# Patient Record
Sex: Female | Born: 1955 | Race: White | Hispanic: No | Marital: Married | Smoking: Never smoker
Health system: Southern US, Community
[De-identification: ages and names within clinical notes are randomized; demographics above are authoritative.]

## PROBLEM LIST (undated history)

## (undated) DIAGNOSIS — T8859XA Other complications of anesthesia, initial encounter: Secondary | ICD-10-CM

## (undated) DIAGNOSIS — I2699 Other pulmonary embolism without acute cor pulmonale: Secondary | ICD-10-CM

## (undated) DIAGNOSIS — M1990 Unspecified osteoarthritis, unspecified site: Secondary | ICD-10-CM

## (undated) DIAGNOSIS — I10 Essential (primary) hypertension: Secondary | ICD-10-CM

## (undated) HISTORY — PX: APPENDECTOMY: SHX54

## (undated) HISTORY — PX: CERVICAL FUSION: SHX112

## (undated) HISTORY — PX: ABDOMINAL HYSTERECTOMY: SHX81

---

## 2004-09-06 ENCOUNTER — Ambulatory Visit (HOSPITAL_COMMUNITY): Admission: RE | Admit: 2004-09-06 | Discharge: 2004-09-06 | Payer: Self-pay | Admitting: Internal Medicine

## 2004-09-10 ENCOUNTER — Ambulatory Visit (HOSPITAL_COMMUNITY): Admission: RE | Admit: 2004-09-10 | Discharge: 2004-09-10 | Payer: Self-pay | Admitting: Internal Medicine

## 2006-01-17 ENCOUNTER — Ambulatory Visit (HOSPITAL_COMMUNITY): Admission: RE | Admit: 2006-01-17 | Discharge: 2006-01-17 | Payer: Self-pay | Admitting: Internal Medicine

## 2007-11-14 ENCOUNTER — Ambulatory Visit (HOSPITAL_COMMUNITY): Admission: RE | Admit: 2007-11-14 | Discharge: 2007-11-14 | Payer: Self-pay | Admitting: Internal Medicine

## 2007-11-28 ENCOUNTER — Ambulatory Visit (HOSPITAL_COMMUNITY): Admission: RE | Admit: 2007-11-28 | Discharge: 2007-11-28 | Payer: Self-pay | Admitting: Orthopaedic Surgery

## 2007-11-29 ENCOUNTER — Ambulatory Visit: Payer: Self-pay | Admitting: Oncology

## 2007-12-06 ENCOUNTER — Ambulatory Visit: Payer: Self-pay | Admitting: Oncology

## 2008-01-02 ENCOUNTER — Encounter (INDEPENDENT_AMBULATORY_CARE_PROVIDER_SITE_OTHER): Payer: Self-pay | Admitting: General Surgery

## 2008-01-02 ENCOUNTER — Inpatient Hospital Stay (HOSPITAL_COMMUNITY): Admission: EM | Admit: 2008-01-02 | Discharge: 2008-01-04 | Payer: Self-pay | Admitting: Emergency Medicine

## 2008-12-26 ENCOUNTER — Ambulatory Visit: Payer: Self-pay | Admitting: Oncology

## 2009-01-05 ENCOUNTER — Ambulatory Visit: Payer: Self-pay | Admitting: Oncology

## 2009-02-06 ENCOUNTER — Ambulatory Visit: Payer: Self-pay | Admitting: Oncology

## 2009-03-07 ENCOUNTER — Ambulatory Visit: Payer: Self-pay | Admitting: Oncology

## 2009-04-07 ENCOUNTER — Ambulatory Visit: Payer: Self-pay | Admitting: Oncology

## 2009-05-05 ENCOUNTER — Ambulatory Visit: Payer: Self-pay | Admitting: Oncology

## 2009-06-05 ENCOUNTER — Ambulatory Visit: Payer: Self-pay | Admitting: Oncology

## 2009-06-12 ENCOUNTER — Ambulatory Visit: Payer: Self-pay | Admitting: Oncology

## 2009-07-05 ENCOUNTER — Ambulatory Visit: Payer: Self-pay | Admitting: Oncology

## 2009-08-05 ENCOUNTER — Ambulatory Visit: Payer: Self-pay | Admitting: Oncology

## 2009-09-04 ENCOUNTER — Ambulatory Visit: Payer: Self-pay | Admitting: Oncology

## 2009-09-11 ENCOUNTER — Ambulatory Visit: Payer: Self-pay | Admitting: Oncology

## 2009-10-05 ENCOUNTER — Ambulatory Visit: Payer: Self-pay | Admitting: Oncology

## 2009-11-05 ENCOUNTER — Ambulatory Visit: Payer: Self-pay | Admitting: Oncology

## 2009-12-05 ENCOUNTER — Ambulatory Visit: Payer: Self-pay | Admitting: Oncology

## 2009-12-18 ENCOUNTER — Ambulatory Visit: Payer: Self-pay | Admitting: Oncology

## 2010-01-05 ENCOUNTER — Ambulatory Visit: Payer: Self-pay | Admitting: Oncology

## 2010-02-04 ENCOUNTER — Ambulatory Visit: Payer: Self-pay | Admitting: Oncology

## 2010-03-12 ENCOUNTER — Ambulatory Visit: Payer: Self-pay | Admitting: Oncology

## 2010-04-07 ENCOUNTER — Ambulatory Visit: Payer: Self-pay | Admitting: Oncology

## 2010-05-06 ENCOUNTER — Ambulatory Visit: Payer: Self-pay | Admitting: Oncology

## 2010-07-20 NOTE — Op Note (Signed)
[name], [name]            ACCOUNT NO.:  [id number]   MEDICAL RECORD NO.:  [id number]          PATIENT TYPE:  INP   LOCATION:  A306                          FACILITY:  [facility]   PHYSICIAN:  Barbaraann Barthel, M.D. DATE OF BIRTH:  13-Dec-1955   DATE OF PROCEDURE:  DATE OF DISCHARGE:                               OPERATIVE REPORT   SURGEON:  Barbaraann Barthel, MD   PREOPERATIVE DIAGNOSIS:  Acute appendicitis.   POSTOPERATIVE DIAGNOSIS:  Acute appendicitis.   PROCEDURE:  Open appendectomy.   WOUND CLASSIFICATION:  Infected.   SPECIMENS:  Appendix.   Note, this is a 55 year old white female who had 24-hour history of  right lower quadrant pain with anorexia and no vomiting.  It is  significant that this patient has been on immunosuppressive drugs for  her rheumatoid arthritis and has recently gotten off steroids for a  flareup of her rheumatoid arthritis.  CT scan showed fluid in the pelvis  and no obvious perforation, but an obviously acutely inflamed appendix.   GROSS OPERATIVE FINDINGS:  Acute suppurative nonperforated appendix that  appeared with significant fibrinous exudate.  Clinically, this appeared  to be accelerated inflammation from her steroid treatment.  As usually,  these findings were consistent with appendicitis of longer duration than  the 24 hour history she gave me.  There was no obvious perforation.  Terminal ileum appeared to be normal.  There was considerable fat  wrapping around the appendix, but no perforation.   TECHNIQUE:  The patient was placed in supine position after the adequate  administration of general anesthesia via endotracheal intubation.  Her  entire abdomen was prepped with Betadine solution and draped in the  usual manner.  A Foley catheter was aseptically inserted.  A transverse  incision was carried out in the right lower quadrant.  The skin and  subcutaneous tissue was divided.  The rectus muscle was retracted  medially and the  posterior sheath and peritoneum were grasped, and the  peritoneal cavity was then incised and opened.  We used a wound liner to  protect the wound from infection.  We then delivered the cecum and the  appendix into the wound where we clamped the mesoappendix with 2-0 silk  and smaller vessels with a Hemoclip device.  We then amputated the  appendix using the TA-30 stapler device.  I elected to over sew the  appendix with 3-0 GI silk and placed fat buttressing this over the  staple line.  We then changed gloves, irrigated the right lower  quadrant, and I elected to leave a Jackson-Pratt drain in the area where  she had most of her inflammation.  There  certainly exists the  possibility here for an abscess formation.  We irrigated the wound again  with sterile saline solution and then checked for hemostasis.  I closed  the peritoneum with a running 0 Vicryl suture and the fascia with  interrupted figure-of-eight Vicryl sutures and closed the skin with a  stapling device.  The drain was sutured in place with 3-0 nylon.  Sterile dressing was applied.  Prior to closure, all sponge, needle, and  instrument counts were found to be correct.  Estimated blood loss was  minimal.  The patient received 650 mL of crystalloids intraoperatively.  There  were no complications.  This patient will be admitted, and I will have  Dr. Ouida Sills take a look at her tomorrow because she acknowledges him as  her private physician and she has some issues with medications for her  arthritis.      Barbaraann Barthel, M.D.  Electronically Signed     WB/MEDQ  D:  01/02/2008  T:  01/03/2008  Job:  [id number]   cc:   Kingsley Callander. Ouida Sills, MD  Fax: [phone]

## 2010-07-20 NOTE — Discharge Summary (Signed)
[name], [name]            ACCOUNT NO.:  [id number]   MEDICAL RECORD NO.:  [id number]          PATIENT TYPE:  INP   LOCATION:  A306                          FACILITY:  [facility]   PHYSICIAN:  Barbaraann Barthel, M.D. DATE OF BIRTH:  1955-06-29   DATE OF ADMISSION:  01/02/2008  DATE OF DISCHARGE:  10/30/2009LH                               DISCHARGE SUMMARY   PROCEDURE:  Open appendectomy on January 02, 2008.   SECONDARY DIAGNOSES:  1. Acute appendicitis.  2. Rheumatoid arthritis.   Note, this is a 55 year old white female who was admitted to the  emergency room with approximately a 24-hour history of right lower  quadrant pain with anorexia and increasing discomfort.  She was worked  up and found on CT scan to have an appendicitis with no obvious  perforation.  She had minimal temperature elevation and a white count  with a slight left shift.  She had also been placed on steroids several  days and had just been off steroids several days prior to this for an  arthritic flare-up and she was taken to surgery after initiation of  hydration and antibiotic therapy.  Intraoperatively, she had an acute  suppurative nonperforated appendix.  The final pathology is pending at  the time of this dictation.  Intraoperatively, there were no other  findings.  However, it was pretty advanced appendicitis with fibrinous  exudate noted.  We did an appendectomy, irrigated the abdomen, and I  left a drain in place.  Postoperatively, the patient did very well.  She  remained afebrile.  Her white count diminished and she was passing gas  and tolerating full-liquid diet without any problems and her wound was  clean and she had no other symptoms of leg pain, shortness of breath,  any dysuria, or any other untoward postoperative findings.  She felt  quite good and we discharged her on the second postoperative day.  Perioperatively, she was kept on IV Cipro.  She will be discharged on  p.o. Cipro for an  additional 5 days.   LABORATORY DATA:  The patient's white count came down to 11.1 with an  H&H of 12.2 and 36.1 and the electrolytes within normal limits.  This  was noted on the first postoperative day.  CT scan as mentioned above  showed acute appendicitis with no obvious perforation.   HOSPITAL COURSE:  As mentioned above, completely uneventful.  The  patient did very well and was discharged on the second postoperative  day, at which time, her Jackson-Pratt drain was removed.   DISCHARGE INSTRUCTIONS:  She is discharged on Cipro 500 mg every 12  hours with the next dose to be given 11:00 p.m. on January 04, 2008.  She is to take Darvocet 1 tablet every 4 hours for pain, Colace 100 mg  daily or every other day as needed for her bowel.  She is told to take  no harsh cathartics like Ex-Lax.  She is told to hold aspirin products  and any arthritic meds, her methotrexate, etc., in the postoperative  period.   Wound care was explained to her.  She is  told to clean her wound with  alcohol 3 times a day.  She is discharged on a full and soft diet.  She  is told to do no heavy lifting.  She is permitted to go up and down the  stairs.  She is permitted  to shower, told to do no driving, no sexual activity in immediate  postoperative period.  We will follow her on Thursday, January 10, 2008,  at 2 o'clock p.m., and she is told to contact me or go to the emergency  room should there be any acute changes.      Barbaraann Barthel, M.D.  Electronically Signed     WB/MEDQ  D:  01/04/2008  T:  01/05/2008  Job:  [id number]   cc:   Kingsley Callander. Ouida Sills, MD  Fax: [phone]

## 2010-07-20 NOTE — Consult Note (Signed)
[name], DEARMAS NO.:  [id number]   MEDICAL RECORD NO.:  [id number]          PATIENT TYPE:  INP   LOCATION:  A306                          FACILITY:  [facility]   PHYSICIAN:  Barbaraann Barthel, M.D. DATE OF BIRTH:  [birth year]   DATE OF CONSULTATION:  01/02/2008  DATE OF DISCHARGE:                                 CONSULTATION   SURGEON:  Barbaraann Barthel, MD.   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   CHIEF COMPLAINT:  Right lower quadrant pain and anorexia for  approximately 24 hours duration.   HISTORY OF PRESENT MEDICAL ILLNESS:  The patient states that she was  feeling well until yesterday morning when she developed some right lower  quadrant pain.  This later increased so that when she did any coughing  or moving around too much, this affected her right lower quadrant and as  this did not resolve, the patient came to the emergency room where she  was worked up and a CT scan showed signs of acute appendicitis.  Surgery  was consulted and responded immediately.   PHYSICAL EXAMINATION:  GENERAL:  A pleasant 55 year old white female,  uncomfortable, but in no acute distress.  VITAL SIGNS:  She is 5 feet 4 inches, weighs 135 pounds, temperature is  97.8, her blood pressure is 125/77, the heart rate 88, and the  respirations are 18.  HEENT:  Head is normocephalic.  Eyes, extraocular movements are intact.  Pupils were round and reactive to light and accommodation.  There is no  conjunctive pallor or scleral injection.  Sclerae is a normal tincture.  Oral and nasal mucosa is moist.  NECK:  Supple and cylindrical.  There is no jugular vein distention,  thyromegaly, or tracheal deviation.  No bruits are auscultated.  CHEST:  Clear, both anterior and posterior auscultation.  There is a incision in  the area of her manubrium consistent with a previous history of  mediastinoscopy.  BREASTS:  The patient has  bilateral breast implants.  I do not palpate  any masses in the axilla or in the surrounding breast tissue.  LUNGS:  Clear.  HEART:  There is a regular rhythm.  ABDOMEN:  Bowel sounds are present.  The patient is exquisitely tender  in the right lower quadrant.  No femoral or inguinal hernias  appreciated.  RECTAL:  Guaiac-negative stool.  EXTREMITIES:  Grossly within normal limits.  The patient does have  multiple arthritic complaints in her ankles and her feet more than  elsewhere as well as her back and her shoulders and her right hip.  She  does not have any obvious ulnar deviation of her hands consistent with a  diagnosis of rheumatoid arthritis; however, she has borne that diagnosis  for the last 10 years or so where she has been treated recently with  methotrexate and other immunosuppressive drugs and she recently had a  flare up where she was treated with steroids a couple of weeks ago.  Sterroid treatment was completed 2 or 3 days ago.   REVIEW OF SYSTEMS:  OB/GYN:  She is a gravida 3, para 2, abortus 1,  cesarean 0 female with no family history of breast cancer and she had a  mammogram that was negative this year.  ENDOCRINE:  No history of  diabetes or thyroid disease.  MUSCULOSKELETAL:  History of rheumatoid  arthritis as mentioned.  CARDIORESPIRATORY:  Nonsmoker, social drinker.  She did have a history of fungal pneumonia that was treated in 2006.   OTHER SURGERIES:  Include a mediastinoscopy in 2006 and breast implants  in 1998.   LABORATORY DATA:  The patient has a white count of 15.4 with an H&H of  13.3 and 39.3 with 78% neutrophils.  Her electrolytes are grossly within  normal limits.  BUN is 7 and creatinine is 0.66.  CT scan showed changes  consistent with acute appendicitis.   MEDICATIONS:  See med list.  She is having her arthritic medicines  changed, but she was taking Remeron and she was on methotrexate.  She is  allergic to BIAXIN, TETRACYCLINE, and  CODEINE.   IMPRESSION:  acute appendicitis.   SECONDARY DIAGNOSIS:  Rheumatoid arthritis, on immunosuppressive drugs.   PLAN:  The patient will be covered with antibiotics.  Hydration has been  initiated.  I have discussed this surgery in detail with the patient and  her husband, they realized that the complications are bleeding and  infection and appendiceal leak.  She has been told that her incidence of  infection is a little greater due to the immunosuppressive drugs that  she has been on and an informed consent was obtained.      Barbaraann Barthel, M.D.  Electronically Signed     WB/MEDQ  D:  01/02/2008  T:  01/03/2008  Job:  [id number]   cc:   Kingsley Callander. Ouida Sills, MD  Fax: [phone]

## 2010-12-07 LAB — CBC
MCHC: 33.8
MCHC: 33.9
Platelets: 222
Platelets: 251
RBC: 3.69 — ABNORMAL LOW
RBC: 4.07
RDW: 13.7
RDW: 13.9

## 2010-12-07 LAB — DIFFERENTIAL
Eosinophils Absolute: 0
Eosinophils Absolute: 0
Eosinophils Relative: 0
Eosinophils Relative: 0
Lymphocytes Relative: 12
Lymphs Abs: 1.3
Lymphs Abs: 1.7
Monocytes Relative: 10
Neutro Abs: 12 — ABNORMAL HIGH

## 2010-12-07 LAB — COMPREHENSIVE METABOLIC PANEL
AST: 73 — ABNORMAL HIGH
Albumin: 3.7
Calcium: 9.3
Creatinine, Ser: 0.66
Sodium: 137
Total Bilirubin: 1.5 — ABNORMAL HIGH

## 2010-12-07 LAB — URINALYSIS, ROUTINE W REFLEX MICROSCOPIC
Glucose, UA: NEGATIVE
Protein, ur: NEGATIVE
Specific Gravity, Urine: <1.005 — ABNORMAL LOW
pH: 7.5

## 2010-12-07 LAB — BASIC METABOLIC PANEL
CO2: 26
Calcium: 8.6
Creatinine, Ser: 0.69
Glucose, Bld: 105 — ABNORMAL HIGH

## 2011-05-24 ENCOUNTER — Other Ambulatory Visit: Payer: Self-pay | Admitting: Oral Surgery

## 2011-05-24 DIAGNOSIS — M2669 Other specified disorders of temporomandibular joint: Secondary | ICD-10-CM

## 2011-05-30 ENCOUNTER — Other Ambulatory Visit (HOSPITAL_COMMUNITY): Payer: Self-pay | Admitting: Internal Medicine

## 2011-05-30 ENCOUNTER — Ambulatory Visit (HOSPITAL_COMMUNITY)
Admission: RE | Admit: 2011-05-30 | Discharge: 2011-05-30 | Disposition: A | Discharge status: Home / Self Care | Payer: BC Managed Care – PPO | Source: Ambulatory Visit | Attending: Internal Medicine | Admitting: Internal Medicine

## 2011-05-30 ENCOUNTER — Inpatient Hospital Stay: Admission: RE | Admit: 2011-05-30 | Payer: Self-pay | Source: Ambulatory Visit

## 2011-05-30 DIAGNOSIS — J3489 Other specified disorders of nose and nasal sinuses: Secondary | ICD-10-CM

## 2011-05-30 DIAGNOSIS — W19XXXA Unspecified fall, initial encounter: Secondary | ICD-10-CM

## 2012-03-19 ENCOUNTER — Emergency Department (HOSPITAL_COMMUNITY)
Admission: EM | Admit: 2012-03-19 | Discharge: 2012-03-19 | Disposition: A | Discharge status: Home / Self Care | Payer: BC Managed Care – PPO | Attending: Emergency Medicine | Admitting: Emergency Medicine

## 2012-03-19 ENCOUNTER — Encounter (HOSPITAL_COMMUNITY): Payer: Self-pay | Admitting: *Deleted

## 2012-03-19 ENCOUNTER — Emergency Department (HOSPITAL_COMMUNITY): Payer: BC Managed Care – PPO

## 2012-03-19 DIAGNOSIS — Y9389 Activity, other specified: Secondary | ICD-10-CM | POA: Insufficient documentation

## 2012-03-19 DIAGNOSIS — Z79899 Other long term (current) drug therapy: Secondary | ICD-10-CM | POA: Insufficient documentation

## 2012-03-19 DIAGNOSIS — Y92009 Unspecified place in unspecified non-institutional (private) residence as the place of occurrence of the external cause: Secondary | ICD-10-CM | POA: Insufficient documentation

## 2012-03-19 DIAGNOSIS — W010XXA Fall on same level from slipping, tripping and stumbling without subsequent striking against object, initial encounter: Secondary | ICD-10-CM | POA: Insufficient documentation

## 2012-03-19 DIAGNOSIS — Z8739 Personal history of other diseases of the musculoskeletal system and connective tissue: Secondary | ICD-10-CM | POA: Insufficient documentation

## 2012-03-19 DIAGNOSIS — S20219A Contusion of unspecified front wall of thorax, initial encounter: Secondary | ICD-10-CM | POA: Insufficient documentation

## 2012-03-19 HISTORY — DX: Unspecified osteoarthritis, unspecified site: M19.90

## 2012-03-19 LAB — URINALYSIS, DIPSTICK ONLY
Leukocytes, UA: NEGATIVE
Nitrite: NEGATIVE
Specific Gravity, Urine: 1.025 (ref 1.005–1.030)

## 2012-03-19 MED ORDER — ONDANSETRON 8 MG PO TBDP
8.0000 mg | ORAL_TABLET | Freq: Once | ORAL | Status: AC
  Administered 2012-03-19: 8 mg via ORAL
  Filled 2012-03-19: qty 1

## 2012-03-19 MED ORDER — ONDANSETRON HCL 8 MG PO TABS: 8.0000 mg | ORAL_TABLET | Freq: Three times a day (TID) | ORAL | Status: DC | PRN

## 2012-03-19 MED ORDER — OXYCODONE-ACETAMINOPHEN 5-325 MG PO TABS
1.0000 | ORAL_TABLET | Freq: Once | ORAL | Status: AC
  Administered 2012-03-19: 1 via ORAL
  Filled 2012-03-19: qty 1

## 2012-03-19 MED ORDER — OXYCODONE-ACETAMINOPHEN 5-325 MG PO TABS: 1.0000 | ORAL_TABLET | ORAL | Status: AC | PRN

## 2012-03-19 NOTE — ED Notes (Signed)
Pt states she tripped and fell and hit corner dresser when she fell. Bruising, abrasion and pain to right flank.

## 2012-03-21 NOTE — ED Provider Notes (Signed)
Medical screening examination/treatment/procedure(s) were performed by non-physician practitioner and as supervising physician I was immediately available for consultation/collaboration.   Uldine Fuster W Bryce Cheever, MD 03/21/12 2322 

## 2012-03-21 NOTE — ED Provider Notes (Signed)
History     CSN: [id number]  Arrival date & time 03/19/12  0909   First MD Initiated Contact with Patient 03/19/12 0913      Chief Complaint  Patient presents with  . Flank Pain  . Fall    (Consider location/radiation/quality/duration/timing/severity/associated sxs/prior treatment) HPI Comments: JENE HUQ is a 57 y.o. Female who presents with moderate right flank pain after tripping over her dog early this am, and landing with her right flank against her nightstand.  She reports immediate pain which has been persistent,  Worse with palpation and with movement, deep inspiration, slouching and lying on her right side.  She has taken a goody's powder with no relief of symptoms.  She denies shortness of breath , abdominal pain, nausea and has had no hematuria.     The history is provided by the patient.    Past Medical History  Diagnosis Date  . Arthritis     Past Surgical History  Procedure Date  . Appendectomy     No family history on file.  History  Substance Use Topics  . Smoking status: Never Smoker   . Smokeless tobacco: Not on file  . Alcohol Use: Yes     Comment: Occ    OB History    Grav Para Term Preterm Abortions TAB SAB Ect Mult Living                  Review of Systems  Constitutional: Negative for fever.  HENT: Negative for congestion, sore throat and neck pain.   Eyes: Negative.   Respiratory: Negative for cough, chest tightness, shortness of breath, wheezing and stridor.   Cardiovascular: Negative for chest pain.  Gastrointestinal: Negative for nausea and abdominal pain.  Genitourinary: Positive for flank pain. Negative for dysuria and hematuria.  Musculoskeletal: Negative for joint swelling and arthralgias.  Skin: Negative.  Negative for rash and wound.  Neurological: Negative for dizziness, weakness, light-headedness, numbness and headaches.  Hematological: Negative.   Psychiatric/Behavioral: Negative.     Allergies  Biaxin;  Codeine; and Tetracyclines & related  Home Medications   Current Outpatient Rx  Name  Route  Sig  Dispense  Refill  . ADALIMUMAB 40 MG/0.8ML [place] KIT   Subcutaneous   Inject 40 mg into the skin every 14 (fourteen) days.         Deeann Dowse BODY PAIN PO   Oral   Take 1 packet by mouth daily as needed. Hip pain related to RA         . FOLIC ACID 1 MG PO TABS   Oral   Take 1 mg by mouth daily.         Marland Kitchen LISINOPRIL 10 MG PO TABS   Oral   Take 10 mg by mouth daily.         Marland Kitchen ONDANSETRON HCL 8 MG PO TABS   Oral   Take 1 tablet (8 mg total) by mouth every 8 (eight) hours as needed for nausea.   12 tablet   0   . OXYCODONE-ACETAMINOPHEN 5-325 MG PO TABS   Oral   Take 1 tablet by mouth every 4 (four) hours as needed for pain.   30 tablet   0     BP 121/80  Pulse 93  Temp 98.4 F (36.9 C) (Oral)  Resp 16  Ht 5\' 4"  (1.626 m)  Wt 148 lb (67.132 kg)  BMI 25.40 kg/m2  SpO2 95%  Physical Exam  Nursing note and vitals  reviewed. Constitutional: She appears well-developed and well-nourished.  HENT:  Head: Normocephalic and atraumatic.  Eyes: Conjunctivae normal are normal.  Neck: Normal range of motion.  Cardiovascular: Normal rate, regular rhythm, normal heart sounds and intact distal pulses.   Pulmonary/Chest: Effort normal and breath sounds normal. She has no wheezes.   She exhibits tenderness.       Dark, linear ecchymosis right flank.  No crepitus,  No edema.  Poor effort with attempt at deep respiration due to increased pain.  Abdominal: Soft. Bowel sounds are normal. There is no tenderness.  Musculoskeletal: Normal range of motion.  Neurological: She is alert.  Skin: Skin is warm and dry.  Psychiatric: She has a normal mood and affect.    ED Course  Procedures (including critical care time)   Labs Reviewed  URINALYSIS, DIPSTICK ONLY  LAB REPORT - SCANNED   No results found.   1. Chest wall contusion       MDM  Patients labs and/or radiological  studies were reviewed during the medical decision making and disposition process. Pt prescribed oxycodone for pain,  Encouraged ice packs,  Starting heat tx in 2 days,  zofran also prescribed given pt reported nausea intolerance to narcotics. PRN f/u. Recheck by pcp if sx worsen, or return here.        Burgess Amor, [place] 03/21/12 2024

## 2014-10-21 ENCOUNTER — Other Ambulatory Visit (HOSPITAL_COMMUNITY): Payer: Self-pay | Admitting: Internal Medicine

## 2014-10-21 DIAGNOSIS — M5412 Radiculopathy, cervical region: Secondary | ICD-10-CM

## 2014-10-27 ENCOUNTER — Ambulatory Visit (HOSPITAL_COMMUNITY)
Admission: RE | Admit: 2014-10-27 | Discharge: 2014-10-27 | Disposition: A | Discharge status: Home / Self Care | Payer: BC Managed Care – PPO | Source: Ambulatory Visit | Attending: Internal Medicine | Admitting: Internal Medicine

## 2014-10-27 DIAGNOSIS — M25512 Pain in left shoulder: Secondary | ICD-10-CM | POA: Insufficient documentation

## 2014-10-27 DIAGNOSIS — M542 Cervicalgia: Secondary | ICD-10-CM | POA: Insufficient documentation

## 2014-10-27 DIAGNOSIS — M4722 Other spondylosis with radiculopathy, cervical region: Secondary | ICD-10-CM | POA: Diagnosis not present

## 2014-10-27 DIAGNOSIS — M5412 Radiculopathy, cervical region: Secondary | ICD-10-CM

## 2014-11-20 ENCOUNTER — Ambulatory Visit (HOSPITAL_COMMUNITY): Payer: BC Managed Care – PPO | Attending: Neurosurgery | Admitting: Physical Therapy

## 2014-11-20 DIAGNOSIS — M5412 Radiculopathy, cervical region: Secondary | ICD-10-CM | POA: Insufficient documentation

## 2014-11-20 DIAGNOSIS — G44221 Chronic tension-type headache, intractable: Secondary | ICD-10-CM | POA: Diagnosis present

## 2014-11-20 DIAGNOSIS — M6281 Muscle weakness (generalized): Secondary | ICD-10-CM | POA: Insufficient documentation

## 2014-11-20 DIAGNOSIS — M5382 Other specified dorsopathies, cervical region: Secondary | ICD-10-CM | POA: Diagnosis present

## 2014-11-20 DIAGNOSIS — R29898 Other symptoms and signs involving the musculoskeletal system: Secondary | ICD-10-CM

## 2014-11-20 NOTE — Therapy (Signed)
[place], [place], [address] Phone: [phone]   Fax:  [phone]  Physical Therapy Evaluation  Patient Details  Name: [name] MRN: [id number] Date of Birth: 1955-09-21 Referring Provider:  Blanche East, MD  Encounter Date: 11/20/2014      PT End of Session - 11/20/14 1619    Visit Number 1   Number of Visits 12   Date for PT Re-Evaluation 12/20/14   Authorization Type [employer]   Authorization - Visit Number 1   Authorization - Number of Visits 10   PT Start Time 3817   PT Stop Time 1610   PT Time Calculation (min) 55 min   Activity Tolerance Patient tolerated treatment well   Behavior During Therapy [facility] for tasks assessed/performed      Past Medical History  Diagnosis Date  . Arthritis     Past Surgical History  Procedure Laterality Date  . Appendectomy      There were no vitals filed for this visit.  Visit Diagnosis:  Cervical radicular pain  Chronic tension-type headache, intractable  Decreased grip strength of left hand  Neck muscle weakness      Subjective Assessment - 11/20/14 1510    Subjective [name] had a remote history of cervical fusion ,(Feb 2nd 2016).  She  did well following the surgery until the first of July.  Since then  she has been experiencing progressive pain that radiates into her LT arm to the elbow area as well as frequent headaches.   She has been referred to PT to reduce her sx of pain and improve her quality of life.     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 constant pain nothing changes the pain.    How long can you stand comfortably? constant pain nothing changes the pain   How long can you walk comfortably? constant pain nothing changes the pain   Currently in Pain? Yes  taking advil    Pain Score 6    Pain  Location Neck   Pain Orientation Left   Pain Descriptors / Indicators Aching;Burning   Pain Radiating Towards elbow    Pain Onset More than a month ago   Aggravating Factors  nothing   Pain Relieving Factors nothing             [facility] PT Assessment - 11/20/14 0001    Assessment   Medical Diagnosis Radicular Cervical pain    Onset Date/Surgical Date 04/08/14   Prior Therapy none for this diagnosis   Precautions   Precautions Cervical   Precaution Comments prominent impingement at C6-7 no activity that increases radiating sx    Required Braces or Orthoses --  none   Restrictions   Weight Bearing Restrictions No   Balance Screen   Has the patient fallen in the past 6 months No   Has the patient had a decrease in activity level because of a fear of falling?  Yes   Is the patient reluctant to leave their home because of a fear of falling?  No   Home Ecologist residence   Prior Function   Level of Independence Independent   Vocation Retired   Leisure sits with mother   Cognition   Overall Cognitive Status Within Functional Limits for tasks assessed   Observation/Other  Assessments   Focus on Therapeutic Outcomes (FOTO)  43   Posture/Postural Control   Posture/Postural Control Postural limitations   Postural Limitations Rounded Shoulders;Forward head   ROM / Strength   AROM / PROM / Strength AROM;Strength   AROM   AROM Assessment Site Shoulder;Cervical   Right/Left Shoulder --  [facility]   Cervical Extension 28 reps increase pain    Cervical - Right Side Bend 35 reps    Cervical - Left Side Bend 18 reps no change    Cervical - Right Rotation 50   Cervical - Left Rotation 48   Strength   Strength Assessment Site Shoulder;Hand;Cervical   Right/Left Shoulder Right;Left   Left Shoulder Flexion 4+/5   Left Shoulder ABduction 4/5   Left Shoulder Internal Rotation 4/5   Left Shoulder External Rotation 4-/5   Right/Left hand Right;Left   Right Hand  Grip (lbs) 58   Left Hand Grip (lbs) 34   Cervical Extension 3/5   Cervical - Right Side Bend 3+/5   Cervical - Left Side Bend 3/5                   [facility] Adult PT Treatment/Exercise - 11/20/14 0001    Exercises   Exercises Neck   Neck Exercises: Seated   Cervical Isometrics Extension;Right lateral flexion;Left lateral flexion;10 reps   Neck Retraction 10 reps   Lateral Flexion Right;Left;10 reps   Lateral Flexion Limitations cervical extension x 10    Other Seated Exercise scapular retraction x 10   Neck Exercises: Supine   Neck Retraction 10 reps   Neck Retraction Limitations scapular retraction x 10    Manual Therapy   Manual Therapy Manual Traction   Manual Traction gentle secondary to post fusion -pain decreased                 PT Education - 11/20/14 1615    Education provided Yes   Person(s) Educated Patient   Methods Explanation;Handout;Demonstration   Comprehension Verbalized understanding;Returned demonstration          PT Short Term Goals - 11/20/14 1629    PT SHORT TERM GOAL #1   Title I HEP   Time 3   Period Days   PT SHORT TERM GOAL #2   Title ROM to be wfl to allow safer driving   Time 2   Period Weeks   PT SHORT TERM GOAL #3   Title Pain level to decrease to a 3  for better life quality   Time 2   Period Weeks   PT SHORT TERM GOAL #4   Title Pt to only be having headaches twice a week    Time 2   Period Weeks   PT SHORT TERM GOAL #5   Title grip strength to increase 5# to allow pt to write for 15 minutes without fatigue   Time 2   Period Weeks           PT Long Term Goals - 11/20/14 1632    PT LONG TERM GOAL #1   Title I advance HEP   Time 4   Period Weeks   PT LONG TERM GOAL #2   Title Strength of cervical mm to increase one grade to allow pain level to decrease to 1/10 80% of the day    Time 4   Period Weeks   PT LONG TERM GOAL #3   Title Pt to be only having one headache a day    Time  4   Period Weeks    PT LONG TERM GOAL #4   Title Pt hand grip to increase 10#   Time 4   Period Weeks   PT LONG TERM GOAL #5   Title Pt to be able to verbalize the importance of posture in cervical care    Time 4   Period Weeks               Plan - 11/20/14 1621    Clinical Impression Statement [name] is a 59 yo female who had a multtiple level cervical fusion in February.  She did well until July when her pain returned.  She now is experience constant cervical pain with intermittent headaches.  She is currenly being referred to PT.  Examination demonstrates decreased cervical strengh, decreased hand grip, decreased shoulder strength, decreased cervical ROM, pain and mm spasm.  She will benefit from skilled PT to address these issues decrease her pain and improve her functional ability.    Pt will benefit from skilled therapeutic intervention in order to improve on the following deficits Decreased activity tolerance;Decreased range of motion;Decreased strength;Decreased scar mobility;Pain;Increased fascial restricitons   Rehab Potential Good   PT Frequency 3x / week   PT Duration 4 weeks   PT Treatment/Interventions ADLs/Self Care Home Management;Therapeutic activities;Therapeutic exercise;Patient/family education;Manual techniques   PT Next Visit Plan Pt to be given putty for had grip exercises, w-back, t-band exercises for posture ensuring cervical stability while completing, continue with manual including cervical traction.  Progress cervical and scapular stability exercises as tolerated.    PT Home Exercise Plan given    Consulted and Agree with Plan of Care Patient         Problem List There are no active problems to display for this patient.   Rayetta Humphrey, PT CLT [phone] 11/20/2014, 4:40 PM  [facility] [address] [place], [place], [address] Phone: [phone]   Fax:  [phone]

## 2014-11-20 NOTE — Patient Instructions (Signed)
Scapular Retraction (Standing)   With arms at sides, pinch shoulder blades together. Repeat __10__ times per set. Do _1___ sets per session. Do _3___ sessions per day.  http://orth.exer.us/944   Copyright  VHI. All rights reserved.  AROM: Neck Extension   Bend head backward. Hold __2__ seconds. Repeat __10__ times per set. Do ___1_ sets per session. Do _2___ sessions per day.  http://orth.exer.us/300   Copyright  VHI. All rights reserved.  AROM: Lateral Neck Flexion   Slowly tilt head toward one shoulder, then the other. Hold each position __3__ seconds. Repeat __10__ times per set. Do __1__ sets per session. Do _2___ sessions per day.  http://orth.exer.us/296   Copyright  VHI. All rights reserved.  Strengthening: Lateral Bend - Isometric (in Neutral)   Using light pressure from fingertips, press into right temple. Resist bending head sideways. Hold _3___ seconds. Repeat _10___ times per set. Do _1___ sets per session. Do __2__ sessions per day.  http://orth.exer.us/302   Copyright  VHI. All rights reserved.  Strengthening: Extension - Isometric (in Neutral)   Using light pressure from fingertips at back of head, resist bending head backward. Hold _3___ seconds. Repeat __10__ times per set. Do _1___ sets per session. Do ___2_ sessions per day.  http://orth.exer.us/308   Copyright  VHI. All rights reserved.

## 2014-11-24 ENCOUNTER — Ambulatory Visit (HOSPITAL_COMMUNITY): Payer: BC Managed Care – PPO | Admitting: Physical Therapy

## 2014-11-24 DIAGNOSIS — M5412 Radiculopathy, cervical region: Secondary | ICD-10-CM | POA: Diagnosis not present

## 2014-11-24 DIAGNOSIS — R29898 Other symptoms and signs involving the musculoskeletal system: Secondary | ICD-10-CM

## 2014-11-24 DIAGNOSIS — M5382 Other specified dorsopathies, cervical region: Secondary | ICD-10-CM

## 2014-11-24 DIAGNOSIS — G44221 Chronic tension-type headache, intractable: Secondary | ICD-10-CM

## 2014-11-24 NOTE — Therapy (Signed)
[place], [place], [address] Phone: [phone]   Fax:  [phone]  Physical Therapy Treatment  Patient Details  Name: [name] MRN: [id number] Date of Birth: [birth year] Referring Shaima Sardinas:  Asencion Noble, MD  Encounter Date: 11/24/2014      PT End of Session - 11/24/14 1709    Visit Number 2   Number of Visits 12   Date for PT Re-Evaluation 12/20/14   Authorization Type BCBS   Authorization - Visit Number 2   Authorization - Number of Visits 10   PT Start Time 7782   PT Stop Time 4235   PT Time Calculation (min) 43 min   Activity Tolerance Patient tolerated treatment well   Behavior During Therapy [facility] for tasks assessed/performed      Past Medical History  Diagnosis Date  . Arthritis     Past Surgical History  Procedure Laterality Date  . Appendectomy      There were no vitals filed for this visit.  Visit Diagnosis:  Cervical radicular pain  Chronic tension-type headache, intractable  Decreased grip strength of left hand  Neck muscle weakness      Subjective Assessment - 11/24/14 1604    Subjective Patient reports she is having a bit of rough day; she fell over the line of a gas pump trying to get gas in [place] the other day and her legs and L UE are sore and  are bothering her. She also reports that she had increased pain after last session.    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 Yes   Pain Score 6    Pain Location --  upper part of L shoulder and runs down to L elbow             [facility] PT Assessment - 11/24/14 0001    Observation/Other Assessments   Observations ROOS test negative                      [facility] Adult PT Treatment/Exercise - 11/24/14 0001    Neck Exercises: Seated   Neck Retraction 15 reps   Other Seated Exercise 3D  cervical and thoracic excursions 1x10; posterior shoulder rolls 1x20   Other Seated Exercise scapular retractions in W pose 1x15   Manual Therapy   Manual Therapy Soft tissue mobilization   Soft tissue mobilization focused on bilateral upper traps today    Manual Traction --   Neck Exercises: Stretches   Upper Trapezius Stretch 2 reps;30 seconds   Corner Stretch 2 reps;30 seconds                PT Education - 11/24/14 1708    Education provided Yes   Education Details reviewed intial eval and discussed HEP; discussed postural and musculoskeletal mechanics of posture and pathology regarding patient's diagnosis    Person(s) Educated Patient   Methods Explanation   Comprehension Verbalized understanding          PT Short Term Goals - 11/20/14 1629    PT SHORT TERM GOAL #1   Title I HEP   Time 3   Period Days   PT SHORT TERM GOAL #2   Title ROM to be wfl to allow safer driving   Time 2   Period Weeks   PT SHORT TERM GOAL #3  Title Pain level to decrease to a 3  for better life quality   Time 2   Period Weeks   PT SHORT TERM GOAL #4   Title Pt to only be having headaches twice a week    Time 2   Period Weeks   PT SHORT TERM GOAL #5   Title grip strength to increase 5# to allow pt to write for 15 minutes without fatigue   Time 2   Period Weeks           PT Long Term Goals - 11/20/14 1632    PT LONG TERM GOAL #1   Title I advance HEP   Time 4   Period Weeks   PT LONG TERM GOAL #2   Title Strength of cervical mm to increase one grade to allow pain level to decrease to 1/10 80% of the day    Time 4   Period Weeks   PT LONG TERM GOAL #3   Title Pt to be only having one headache a day    Time 4   Period Weeks   PT LONG TERM GOAL #4   Title Pt hand grip to increase 10#   Time 4   Period Weeks   PT LONG TERM GOAL #5   Title Pt to be able to verbalize the importance of posture in cervical care    Time 4   Period Weeks               Plan -  11/24/14 1710    Clinical Impression Statement Performed functional stretches and stability exercises today, as well as postural exercises and stretching. ROOS test negative except for patient's pre-existing symptoms today. Noted significant muscle knotting in bilateral upper traps and focused on this with manual today. Gave patient a tub of putty for L UE/hand strength and also reviewed/gave copy of initial eval.    Pt will benefit from skilled therapeutic intervention in order to improve on the following deficits Decreased activity tolerance;Decreased range of motion;Decreased strength;Decreased scar mobility;Pain;Increased fascial restricitons   Rehab Potential Good   PT Frequency 3x / week   PT Duration 4 weeks   PT Treatment/Interventions ADLs/Self Care Home Management;Therapeutic activities;Therapeutic exercise;Patient/family education;Manual techniques   PT Next Visit Plan w-back, t-band exercises for posture ensuring cervical stability while completing, continue with manual including cervical traction.  Progress cervical and scapular stability exercises as tolerated.    PT Home Exercise Plan given    Consulted and Agree with Plan of Care Patient        Problem List There are no active problems to display for this patient.   Deniece Ree PT, DPT [phone]  [facility] [address] [place], [place], [address] Phone: [phone]   Fax:  [phone]

## 2014-11-25 ENCOUNTER — Encounter (HOSPITAL_COMMUNITY): Payer: BC Managed Care – PPO

## 2014-11-27 ENCOUNTER — Ambulatory Visit (HOSPITAL_COMMUNITY): Payer: BC Managed Care – PPO | Admitting: Physical Therapy

## 2014-11-27 DIAGNOSIS — M5382 Other specified dorsopathies, cervical region: Secondary | ICD-10-CM

## 2014-11-27 DIAGNOSIS — M5412 Radiculopathy, cervical region: Secondary | ICD-10-CM

## 2014-11-27 DIAGNOSIS — R29898 Other symptoms and signs involving the musculoskeletal system: Secondary | ICD-10-CM

## 2014-11-27 DIAGNOSIS — G44221 Chronic tension-type headache, intractable: Secondary | ICD-10-CM

## 2014-11-27 NOTE — Therapy (Signed)
[facility] [address] [place], [place], [address] Phone: [phone]   Fax:  [phone]  Physical Therapy Treatment  Patient Details  Name: [name] MRN: [id number] Date of Birth: 26-Apr-1955 Referring Provider:  Asencion Noble, MD  Encounter Date: 11/27/2014      PT End of Session - 11/27/14 1208    Visit Number (p) 3   Number of Visits (p) 12   Date for PT Re-Evaluation (p) 12/20/14   Authorization Type (p) BCBS   Authorization - Visit Number (p) 3   Authorization - Number of Visits (p) 10   PT Start Time (p) 1015   PT Stop Time (p) 1100   PT Time Calculation (min) (p) 45 min   Activity Tolerance (p) Patient tolerated treatment well   Behavior During Therapy (p) WFL for tasks assessed/performed      Past Medical History  Diagnosis Date  . Arthritis     Past Surgical History  Procedure Laterality Date  . Appendectomy      There were no vitals filed for this visit.  Visit Diagnosis:  Cervical radicular pain  Chronic tension-type headache, intractable  Decreased grip strength of left hand  Neck muscle weakness      Subjective Assessment - 11/27/14 1015    Subjective Patient reports that she is feeling about the same, hasn't noticed any major changes yet but does realize that she has not been coming long at all.    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 Yes   Pain Score 5    Pain Location Other (Comment)  upper part of L shoulder and elbow    Pain Orientation Left                         [facility] Adult PT Treatment/Exercise - 11/27/14 0001    Neck Exercises: Standing   Wall Push Ups 10 reps   Other Standing Exercises Shoulder flexion and scaption 1x10 with 1#    Neck Exercises: Seated   Neck Retraction 15 reps   Other Seated Exercise 3D cervical and thoracic  excursions 1x15; posterior shoulder rolls 1x20   Other Seated Exercise scapular retractions in W pose 1x15   Manual Therapy   Manual Therapy Soft tissue mobilization   Soft tissue mobilization focused on bilateral upper traps today    Manual Traction gentle and done manually 5x15 seconds    Neck Exercises: Stretches   Upper Trapezius Stretch 3 reps;30 seconds   Corner Stretch 3 reps;30 seconds                PT Education - 11/27/14 1208    Education provided Yes   Education Details importance of good posture in reducing neck pain    Person(s) Educated Patient   Methods Explanation   Comprehension Verbalized understanding          PT Short Term Goals - 11/20/14 1629    PT SHORT TERM GOAL #1   Title I HEP   Time 3   Period Days   PT SHORT TERM GOAL #2   Title ROM to be wfl to allow safer driving   Time 2   Period Weeks   PT SHORT TERM GOAL #3   Title Pain level to decrease to a 3  for better life quality   Time  2   Period Weeks   PT SHORT TERM GOAL #4   Title Pt to only be having headaches twice a week    Time 2   Period Weeks   PT SHORT TERM GOAL #5   Title grip strength to increase 5# to allow pt to write for 15 minutes without fatigue   Time 2   Period Weeks           PT Long Term Goals - 11/20/14 1632    PT LONG TERM GOAL #1   Title I advance HEP   Time 4   Period Weeks   PT LONG TERM GOAL #2   Title Strength of cervical mm to increase one grade to allow pain level to decrease to 1/10 80% of the day    Time 4   Period Weeks   PT LONG TERM GOAL #3   Title Pt to be only having one headache a day    Time 4   Period Weeks   PT LONG TERM GOAL #4   Title Pt hand grip to increase 10#   Time 4   Period Weeks   PT LONG TERM GOAL #5   Title Pt to be able to verbalize the importance of posture in cervical care    Time 4   Period Weeks               Plan - 11/27/14 1259    Clinical Impression Statement Continued with functional stretches  and stabiliyt exercises today with good tolerance by patient. Did note some apparent scapular weakness and possible dyskinesis with flexion and abduction today. Also continued with gentle manual traction today.    Pt will benefit from skilled therapeutic intervention in order to improve on the following deficits Decreased activity tolerance;Decreased range of motion;Decreased strength;Decreased scar mobility;Pain;Increased fascial restricitons   Rehab Potential Good   PT Frequency 3x / week   PT Duration 4 weeks   PT Treatment/Interventions ADLs/Self Care Home Management;Therapeutic activities;Therapeutic exercise;Patient/family education;Manual techniques   PT Next Visit Plan w-back, t-band exercises for posture ensuring cervical stability while completing, continue with manual including cervical traction.  Progress cervical and scapular stability exercises as tolerated. Further assess possible scapular dyskineses.    PT Home Exercise Plan given    Consulted and Agree with Plan of Care Patient        Problem List There are no active problems to display for this patient.  Deniece Ree PT, DPT [phone]  [facility] [address] [place], [place], [address] Phone: [phone]   Fax:  [phone]

## 2014-12-02 ENCOUNTER — Ambulatory Visit (HOSPITAL_COMMUNITY): Payer: BC Managed Care – PPO | Admitting: Physical Therapy

## 2014-12-03 ENCOUNTER — Ambulatory Visit (HOSPITAL_COMMUNITY): Payer: BC Managed Care – PPO | Admitting: Physical Therapy

## 2014-12-03 DIAGNOSIS — G44221 Chronic tension-type headache, intractable: Secondary | ICD-10-CM

## 2014-12-03 DIAGNOSIS — M5412 Radiculopathy, cervical region: Secondary | ICD-10-CM | POA: Diagnosis not present

## 2014-12-03 DIAGNOSIS — R29898 Other symptoms and signs involving the musculoskeletal system: Secondary | ICD-10-CM

## 2014-12-03 DIAGNOSIS — M5382 Other specified dorsopathies, cervical region: Secondary | ICD-10-CM

## 2014-12-03 NOTE — Therapy (Signed)
[place], [place], [address] Phone: [phone]   Fax:  [phone]  Physical Therapy Treatment  Patient Details  Name: [name] MRN: [id number] Date of Birth: 1955/03/26 Referring Lexy Meininger:  Blanche East, MD  Encounter Date: 12/03/2014      PT End of Session - 12/03/14 1612    Visit Number 3   Number of Visits 12   Date for PT Re-Evaluation 12/20/14   Authorization Type BCBS   Authorization - Visit Number 3   Authorization - Number of Visits 10   PT Start Time 1522   PT Stop Time 1610   PT Time Calculation (min) 48 min      Past Medical History  Diagnosis Date  . Arthritis     Past Surgical History  Procedure Laterality Date  . Appendectomy      There were no vitals filed for this visit.  Visit Diagnosis:  Cervical radicular pain  Chronic tension-type headache, intractable  Decreased grip strength of left hand  Neck muscle weakness      Subjective Assessment - 12/03/14 1524    Subjective Pt states that her neck was feeling better but then she lifted something and it aggrevated her neck.    Currently in Pain? Yes   Pain Score 7    Pain Location Neck   Pain Orientation Left   Pain Type Chronic pain                         [facility] Adult PT Treatment/Exercise - 12/03/14 0001    Neck Exercises: Theraband   Scapula Retraction 10 reps;Red   Shoulder Extension 10 reps;Red   Rows 10 reps;Red   Neck Exercises: Standing   Other Standing Exercises Shoulder flexion and scaption 1x10 with 1#    Neck Exercises: Seated   Neck Retraction 15 reps   W Back Weights (lbs) 10x 2 #    Shoulder Shrugs 10 reps   Shoulder Shrugs Limitations up/back relax    Shoulder Flexion Both;10 reps;Weights   Shoulder Flexion Weights (lbs) 1   Shoulder ABduction Both;10 reps;Weights   Shoulder Abduction Weights (lbs) 1   Other Seated Exercise 3D cervical and thoracic excursions 1x15;  posterior shoulder rolls 1x20   Other Seated Exercise scapular retractions in W pose 1x15   Neck Exercises: Supine   Cervical Isometrics Extension;Right lateral flexion;Left lateral flexion;3 secs;10 reps   Manual Therapy   Manual Therapy Soft tissue mobilization;Manual Traction   Soft tissue mobilization focused on bilateral upper traps today    Manual Traction gentle and done manually 5x15 seconds    Neck Exercises: Stretches   Upper Trapezius Stretch 3 reps;30 seconds   Neck Stretch 3 reps;30 seconds                PT Education - 12/03/14 1611    Education provided Yes   Education Details t-band postural exercises    Person(s) Educated Patient   Methods Explanation;Demonstration;Handout   Comprehension Verbalized understanding;Returned demonstration          PT Short Term Goals - 11/20/14 1629    PT SHORT TERM GOAL #1   Title I HEP   Time 3   Period Days   PT SHORT TERM GOAL #2   Title ROM to be wfl to allow safer driving   Time 2   Period Weeks   PT SHORT TERM GOAL #3   Title Pain level  to decrease to a 3  for better life quality   Time 2   Period Weeks   PT SHORT TERM GOAL #4   Title Pt to only be having headaches twice a week    Time 2   Period Weeks   PT SHORT TERM GOAL #5   Title grip strength to increase 5# to allow pt to write for 15 minutes without fatigue   Time 2   Period Weeks           PT Long Term Goals - 11/20/14 1632    PT LONG TERM GOAL #1   Title I advance HEP   Time 4   Period Weeks   PT LONG TERM GOAL #2   Title Strength of cervical mm to increase one grade to allow pain level to decrease to 1/10 80% of the day    Time 4   Period Weeks   PT LONG TERM GOAL #3   Title Pt to be only having one headache a day    Time 4   Period Weeks   PT LONG TERM GOAL #4   Title Pt hand grip to increase 10#   Time 4   Period Weeks   PT LONG TERM GOAL #5   Title Pt to be able to verbalize the importance of posture in cervical care     Time 4   Period Weeks               Plan - 12/03/14 1612    Clinical Impression Statement Pt states she went to an orthopedic MD at [facility] who told her to continue physical therapy.  Pt states she was placed on new meds.  Pt had no mm spasms palpatable with manual although she did have some tightness in upper traps.  Ptwith improved discomfort with manual traction.    PT Next Visit Plan begin prone stability exercises including rows, shoulder extension,double arm raise and w-back         Problem List There are no active problems to display for this patient.  Rayetta Humphrey, PT CLT [phone]  12/03/2014, 4:17 PM  [facility] [address] [place], [place], [address] Phone: [phone]   Fax:  [phone]

## 2014-12-04 ENCOUNTER — Ambulatory Visit (HOSPITAL_COMMUNITY): Payer: BC Managed Care – PPO | Admitting: Physical Therapy

## 2014-12-04 DIAGNOSIS — G44221 Chronic tension-type headache, intractable: Secondary | ICD-10-CM

## 2014-12-04 DIAGNOSIS — M5412 Radiculopathy, cervical region: Secondary | ICD-10-CM | POA: Diagnosis not present

## 2014-12-04 DIAGNOSIS — R29898 Other symptoms and signs involving the musculoskeletal system: Secondary | ICD-10-CM

## 2014-12-04 DIAGNOSIS — M5382 Other specified dorsopathies, cervical region: Secondary | ICD-10-CM

## 2014-12-04 NOTE — Therapy (Signed)
[place], [place], [address] Phone: [phone]   Fax:  [phone]  Physical Therapy Treatment  Patient Details  Name: [name] MRN: [id number] Date of Birth: 16-Aug-1955 Referring Provider:  Blanche East, MD  Encounter Date: 12/04/2014      PT End of Session - 12/04/14 1050    Visit Number 4   Number of Visits 12   Date for PT Re-Evaluation 12/20/14   Authorization Type BCBS   Authorization - Visit Number 4   Authorization - Number of Visits 10   PT Start Time 1010   PT Stop Time 0947   PT Time Calculation (min) 43 min      Past Medical History  Diagnosis Date  . Arthritis     Past Surgical History  Procedure Laterality Date  . Appendectomy      There were no vitals filed for this visit.  Visit Diagnosis:  Cervical radicular pain  Chronic tension-type headache, intractable  Decreased grip strength of left hand  Neck muscle weakness      Subjective Assessment - 12/04/14 1004    Subjective Pt states that last night her pain was better but she woke up with increased pain    Currently in Pain? Yes   Pain Score 6    Pain Location Neck   Pain Orientation Left   Pain Descriptors / Indicators Aching   Pain Type Chronic pain   Pain Onset More than a month ago                         [facility] Adult PT Treatment/Exercise - 12/04/14 0001    Exercises   Exercises Neck   Neck Exercises: Machines for Strengthening   UBE (Upper Arm Bike) 4' baclward    Neck Exercises: Theraband   Scapula Retraction 10 reps;Green   Shoulder Extension 10 reps;Green   Rows 10 reps;Green   Neck Exercises: Standing   Wall Push Ups 10 reps   Other Standing Exercises Shoulder flexion,abduction  and scaption 2#x10 with 1#    Neck Exercises: Sidelying   Lateral Flexion Both;10 reps   Neck Exercises: Prone   Axial Exentsion 10 reps   Shoulder Extension 10 reps   Shoulder Extension Weights  (lbs) 2   Rows 10 reps   Rows Weights (lbs) 2   Manual Therapy   Manual Therapy Soft tissue mobilization;Manual Traction   Soft tissue mobilization focused on bilateral upper traps today    Manual Traction gentle and done manually 5x15 seconds                 PT Education - 12/03/14 1611    Education provided Yes   Education Details t-band postural exercises    Person(s) Educated Patient   Methods Explanation;Demonstration;Handout   Comprehension Verbalized understanding;Returned demonstration          PT Short Term Goals - 11/20/14 1629    PT SHORT TERM GOAL #1   Title I HEP   Time 3   Period Days   PT SHORT TERM GOAL #2   Title ROM to be wfl to allow safer driving   Time 2   Period Weeks   PT SHORT TERM GOAL #3   Title Pain level to decrease to a 3  for better life quality   Time 2   Period Weeks   PT SHORT TERM GOAL #4   Title Pt to only be  having headaches twice a week    Time 2   Period Weeks   PT SHORT TERM GOAL #5   Title grip strength to increase 5# to allow pt to write for 15 minutes without fatigue   Time 2   Period Weeks           PT Long Term Goals - 11/20/14 1632    PT LONG TERM GOAL #1   Title I advance HEP   Time 4   Period Weeks   PT LONG TERM GOAL #2   Title Strength of cervical mm to increase one grade to allow pain level to decrease to 1/10 80% of the day    Time 4   Period Weeks   PT LONG TERM GOAL #3   Title Pt to be only having one headache a day    Time 4   Period Weeks   PT LONG TERM GOAL #4   Title Pt hand grip to increase 10#   Time 4   Period Weeks   PT LONG TERM GOAL #5   Title Pt to be able to verbalize the importance of posture in cervical care    Time 4   Period Weeks               Plan - 12/04/14 1051    Clinical Impression Statement Pt feeling better overall.  Progressed pt to prone stabilization and sidelying strengthening exercises.  Continues to have tightness in upper trapezius mm but this is  decreasing    PT Next Visit Plan begin cervical extension with head over table; D/C t-band exercises to HEP as pt has good form          Problem List There are no active problems to display for this patient.  Rayetta Humphrey, PT CLT [phone] 12/04/2014, 10:54 AM  [facility] [place], [place], [address] Phone: [phone]   Fax:  [phone]

## 2014-12-08 ENCOUNTER — Ambulatory Visit (HOSPITAL_COMMUNITY): Payer: BC Managed Care – PPO | Attending: Neurosurgery | Admitting: Physical Therapy

## 2014-12-08 DIAGNOSIS — G44221 Chronic tension-type headache, intractable: Secondary | ICD-10-CM | POA: Insufficient documentation

## 2014-12-08 DIAGNOSIS — M6281 Muscle weakness (generalized): Secondary | ICD-10-CM | POA: Diagnosis present

## 2014-12-08 DIAGNOSIS — M5382 Other specified dorsopathies, cervical region: Secondary | ICD-10-CM | POA: Diagnosis present

## 2014-12-08 DIAGNOSIS — M5412 Radiculopathy, cervical region: Secondary | ICD-10-CM | POA: Diagnosis present

## 2014-12-08 DIAGNOSIS — R29898 Other symptoms and signs involving the musculoskeletal system: Secondary | ICD-10-CM

## 2014-12-08 NOTE — Therapy (Signed)
[place], [place], [address] Phone: [phone]   Fax:  [phone]  Physical Therapy Treatment  Patient Details  Name: [name] MRN: [id number] Date of Birth: 1955-11-01 Referring Provider:  Asencion Noble, MD  Encounter Date: 12/08/2014      PT End of Session - 12/08/14 1608    Visit Number 5   Number of Visits 12   Date for PT Re-Evaluation 12/20/14   Authorization Type BCBS   Authorization - Visit Number 5   Authorization - Number of Visits 10   PT Start Time 6659   PT Stop Time 1605   PT Time Calculation (min) 50 min   Activity Tolerance Patient tolerated treatment well      Past Medical History  Diagnosis Date  . Arthritis     Past Surgical History  Procedure Laterality Date  . Appendectomy      There were no vitals filed for this visit.  Visit Diagnosis:  Cervical radicular pain  Chronic tension-type headache, intractable  Decreased grip strength of left hand  Neck muscle weakness      Subjective Assessment - 12/08/14 1513    Subjective Pt states that she is hurting more under her arm today.    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 constant pain nothing changes the pain.    Currently in Pain? Yes   Pain Score 6    Pain Location Neck   Pain Orientation Left   Pain Descriptors / Indicators Aching   Pain Type Chronic pain                         [facility] Adult PT Treatment/Exercise - 12/08/14 1524    Exercises   Exercises Neck;Shoulder   Neck Exercises: Machines for Strengthening   UBE (Upper Arm Bike) 4' baclward    Neck Exercises: Theraband   Scapula Retraction --   Shoulder Extension --   Rows --   Neck Exercises: Standing   Wall Push Ups 10 reps   Thumb Tacks x10   Other Standing Exercises Shoulder  flexion,abduction  and scaption 2#x10 with 1#    Other Standing Exercises median N stretch 30" x 3   Neck Exercises: Seated   Other Seated Exercise 3D cervical and thoracic excursions 1x15; posterior shoulder rolls 1x20   Neck Exercises: Sidelying   Lateral Flexion Both;10 reps   Neck Exercises: Prone   Axial Exentsion 10 reps   W Back 10 reps   Shoulder Extension 10 reps   Shoulder Extension Weights (lbs) 2   Rows 10 reps   Rows Weights (lbs) 2   Upper Extremity Flexion with Stabilization Flexion;10 reps   Other Prone Exercise cervical extensin with head over table. x 10    Manual Therapy   Manual Therapy Soft tissue mobilization;Manual Traction   Soft tissue mobilization focused on bilateral upper traps today    Manual Traction gentle and done manually 5x15 seconds    Neck Exercises: Stretches   Other Neck Stretches radial N. stretch x 10                 PT Education - 12/08/14 1605    Education provided Yes   Education Details for median nerve stretch    Person(s) Educated Patient   Methods Explanation;Demonstration;Handout  Comprehension Verbalized understanding;Returned demonstration          PT Short Term Goals - 11/20/14 1629    PT SHORT TERM GOAL #1   Title I HEP   Time 3   Period Days   PT SHORT TERM GOAL #2   Title ROM to be wfl to allow safer driving   Time 2   Period Weeks   PT SHORT TERM GOAL #3   Title Pain level to decrease to a 3  for better life quality   Time 2   Period Weeks   PT SHORT TERM GOAL #4   Title Pt to only be having headaches twice a week    Time 2   Period Weeks   PT SHORT TERM GOAL #5   Title grip strength to increase 5# to allow pt to write for 15 minutes without fatigue   Time 2   Period Weeks           PT Long Term Goals - 11/20/14 1632    PT LONG TERM GOAL #1   Title I advance HEP   Time 4   Period Weeks   PT LONG TERM GOAL #2   Title Strength of cervical mm to increase one grade to allow pain level to  decrease to 1/10 80% of the day    Time 4   Period Weeks   PT LONG TERM GOAL #3   Title Pt to be only having one headache a day    Time 4   Period Weeks   PT LONG TERM GOAL #4   Title Pt hand grip to increase 10#   Time 4   Period Weeks   PT LONG TERM GOAL #5   Title Pt to be able to verbalize the importance of posture in cervical care    Time 4   Period Weeks               Plan - 12/08/14 1609    Clinical Impression Statement Pt frustrated with feeling better then regressing;  encouraged pt to continue to be aware of postrue and to complete scapular and cervical stability exercises.  Pt given median N. stretch.  Manual focused more on manual traction than soft tissue.  Will not complete mechanical due to fusion.    PT Next Visit Plan continue with stability exercises.         Problem List There are no active problems to display for this patient.  Rayetta Humphrey, PT CLT [phone] 12/08/2014, 4:12 PM  [facility] [address] [place], [place], [address] Phone: [phone]   Fax:  [phone]

## 2014-12-08 NOTE — Patient Instructions (Signed)
Upper Limb Neural Tension: Median II    Stand with left palm flat on wall, fingers up. Bend elbow, side-bend head away and Hold _30___ seconds. Straighten elbow and Hold _30___ seconds. Repeat 2____ times per set. Do 1____ sets per session. Do _2___ sessions per day.  http://orth.exer.us/404   Copyright  VHI. All rights reserved.

## 2014-12-10 ENCOUNTER — Ambulatory Visit (HOSPITAL_COMMUNITY): Payer: BC Managed Care – PPO | Admitting: Physical Therapy

## 2014-12-10 DIAGNOSIS — G44221 Chronic tension-type headache, intractable: Secondary | ICD-10-CM

## 2014-12-10 DIAGNOSIS — M5412 Radiculopathy, cervical region: Secondary | ICD-10-CM

## 2014-12-10 DIAGNOSIS — M5382 Other specified dorsopathies, cervical region: Secondary | ICD-10-CM

## 2014-12-10 NOTE — Therapy (Signed)
[facility] [place], [place], [address] Phone: [phone]   Fax:  [phone]  Physical Therapy Treatment  Patient Details  Name: [name] MRN: [id number] Date of Birth: [birth year] Referring Provider:  Asencion Noble, MD  Encounter Date: 12/10/2014      PT End of Session - 12/10/14 1610    Visit Number 6   Number of Visits 12   Date for PT Re-Evaluation 12/20/14   Authorization Type BCBS   Authorization - Visit Number 6   Authorization - Number of Visits 10   PT Start Time 8937   PT Stop Time 1603   PT Time Calculation (min) 48 min   Activity Tolerance Patient tolerated treatment well   Behavior During Therapy [facility] for tasks assessed/performed      Past Medical History  Diagnosis Date  . Arthritis     Past Surgical History  Procedure Laterality Date  . Appendectomy      There were no vitals filed for this visit.  Visit Diagnosis:  Cervical radicular pain  Chronic tension-type headache, intractable  Neck muscle weakness      Subjective Assessment - 12/10/14 1517    Subjective Pt reports that her pain was really bad yesterday, and she called her spine doctor about it. She reports that she is going to be getting cervical injections some time in the near future.    Currently in Pain? Yes   Pain Score 5    Pain Location Shoulder  shoulderblade and arm   Pain Orientation Left                [facility] Adult PT Treatment/Exercise - 12/10/14 0001    Neck Exercises: Machines for Strengthening   UBE (Upper Arm Bike) 4' baclward    Neck Exercises: Supine   Neck Retraction 10 reps;3 secs   Neck Exercises: Prone   W Back 10 reps   Shoulder Extension 10 reps   Shoulder Extension Weights (lbs) 2   Rows 10 reps   Rows Weights (lbs) 2   Other Prone Exercise prone T's with 2# x 10   Manual Therapy   Manual Therapy Soft tissue mobilization;Myofascial release   Soft tissue mobilization L upper trap,  mid trap, rhomboids, supraspinatus, infraspinatus in prone   Myofascial Release TrP release to L infraspinatus                PT Education - 12/10/14 1606    Education provided Yes   Education Details Educated on using tennis ball for infraspinatus trigger point release   Person(s) Educated Patient   Methods Explanation;Demonstration   Comprehension Verbalized understanding          PT Short Term Goals - 11/20/14 1629    PT SHORT TERM GOAL #1   Title I HEP   Time 3   Period Days   PT SHORT TERM GOAL #2   Title ROM to be wfl to allow safer driving   Time 2   Period Weeks   PT SHORT TERM GOAL #3   Title Pain level to decrease to a 3  for better life quality   Time 2   Period Weeks   PT SHORT TERM GOAL #4   Title Pt to only be having headaches twice a week    Time 2   Period Weeks   PT SHORT TERM GOAL #5   Title grip strength to increase 5# to allow pt to write for 15  minutes without fatigue   Time 2   Period Weeks           PT Long Term Goals - 11/20/14 1632    PT LONG TERM GOAL #1   Title I advance HEP   Time 4   Period Weeks   PT LONG TERM GOAL #2   Title Strength of cervical mm to increase one grade to allow pain level to decrease to 1/10 80% of the day    Time 4   Period Weeks   PT LONG TERM GOAL #3   Title Pt to be only having one headache a day    Time 4   Period Weeks   PT LONG TERM GOAL #4   Title Pt hand grip to increase 10#   Time 4   Period Weeks   PT LONG TERM GOAL #5   Title Pt to be able to verbalize the importance of posture in cervical care    Time 4   Period Weeks               Plan - 12/10/14 1612    Clinical Impression Statement Pt presented reporting pain in her scapular region and L shoulder. Manual therapy was completed to periscapular mm in prone, trigger point was present in L infraspinatus. Trigger point release was completed with reports of decreased pain following manual therapy. Postural strengthening was  continued today, along with supine chin tucks with focus on proper form. Pt reported decreased pain post treatment.    PT Next Visit Plan Continue with manual PRN, cervical stabilization and postural strengthening        Problem List There are no active problems to display for this patient.   Hilma Favors, PT, DPT [phone] 12/10/2014, 4:17 PM  [facility] [address] [place], [place], [address] Phone: [phone]   Fax:  [phone]

## 2014-12-12 ENCOUNTER — Ambulatory Visit (HOSPITAL_COMMUNITY): Payer: BC Managed Care – PPO | Admitting: Physical Therapy

## 2014-12-15 ENCOUNTER — Telehealth (HOSPITAL_COMMUNITY): Payer: Self-pay | Admitting: Physical Therapy

## 2014-12-15 ENCOUNTER — Ambulatory Visit (HOSPITAL_COMMUNITY): Payer: BC Managed Care – PPO | Admitting: Physical Therapy

## 2014-12-15 NOTE — Telephone Encounter (Signed)
Called RE missed appointment.   No answer.  [name], [place] CLT [phone]

## 2014-12-17 ENCOUNTER — Ambulatory Visit (HOSPITAL_COMMUNITY): Payer: BC Managed Care – PPO | Admitting: Physical Therapy

## 2014-12-17 DIAGNOSIS — M5382 Other specified dorsopathies, cervical region: Secondary | ICD-10-CM

## 2014-12-17 DIAGNOSIS — M5412 Radiculopathy, cervical region: Secondary | ICD-10-CM

## 2014-12-17 DIAGNOSIS — R29898 Other symptoms and signs involving the musculoskeletal system: Secondary | ICD-10-CM

## 2014-12-17 DIAGNOSIS — G44221 Chronic tension-type headache, intractable: Secondary | ICD-10-CM

## 2014-12-17 NOTE — Therapy (Signed)
[place], [place], [address] Phone: [phone]   Fax:  [phone]  Physical Therapy Treatment  Patient Details  Name: [name] MRN: [id number] Date of Birth: [birth year] Referring Provider:  Blanche East, MD  Encounter Date: 12/17/2014      PT End of Session - 12/17/14 1630    Visit Number 7   Number of Visits 12   Date for PT Re-Evaluation 12/20/14   Authorization Type BCBS   Authorization - Visit Number 7   Authorization - Number of Visits 10   PT Start Time 6195   PT Stop Time 1600   PT Time Calculation (min) 42 min   Activity Tolerance Patient tolerated treatment well   Behavior During Therapy [facility] for tasks assessed/performed      Past Medical History  Diagnosis Date  . Arthritis     Past Surgical History  Procedure Laterality Date  . Appendectomy      There were no vitals filed for this visit.  Visit Diagnosis:  Cervical radicular pain  Chronic tension-type headache, intractable  Neck muscle weakness  Decreased grip strength of left hand      Subjective Assessment - 12/17/14 1628    Subjective Pt states she returned to Dr Willey Blade on Monday and he put her on a steroid pack and hydrocodone.  States xrays show impingement at C5-6.  States she already feels better with pain at 3/10.   Currently in Pain? Yes   Pain Score 3    Pain Location Thoracic   Pain Orientation Left   Pain Descriptors / Indicators Aching                         [facility] Adult PT Treatment/Exercise - 12/17/14 1518    Neck Exercises: Machines for Strengthening   UBE (Upper Arm Bike) 4' backward    Neck Exercises: Theraband   Scapula Retraction 10 reps;Red   Shoulder Extension 10 reps;Red   Rows 10 reps;Red   Neck Exercises: Standing   Other Standing Exercises Shoulder flexion,abduction  and scaption 2#x10 with 1#    Neck Exercises: Prone   W Back 15 reps   Shoulder Extension 15 reps    Shoulder Extension Weights (lbs) 2   Rows 15 reps   Rows Weights (lbs) 2   Other Prone Exercise prone T's with 2# x 10   Manual Therapy   Manual Therapy Soft tissue mobilization;Myofascial release   Soft tissue mobilization Bil upper trap (Lt>Rt), mid trap, rhomboids, supraspinatus, infraspinatus in prone                  PT Short Term Goals - 11/20/14 1629    PT SHORT TERM GOAL #1   Title I HEP   Time 3   Period Days   PT SHORT TERM GOAL #2   Title ROM to be wfl to allow safer driving   Time 2   Period Weeks   PT SHORT TERM GOAL #3   Title Pain level to decrease to a 3  for better life quality   Time 2   Period Weeks   PT SHORT TERM GOAL #4   Title Pt to only be having headaches twice a week    Time 2   Period Weeks   PT SHORT TERM GOAL #5   Title grip strength to increase 5# to allow pt to write for 15 minutes without fatigue  Time 2   Period Weeks           PT Long Term Goals - 11/20/14 1632    PT LONG TERM GOAL #1   Title I advance HEP   Time 4   Period Weeks   PT LONG TERM GOAL #2   Title Strength of cervical mm to increase one grade to allow pain level to decrease to 1/10 80% of the day    Time 4   Period Weeks   PT LONG TERM GOAL #3   Title Pt to be only having one headache a day    Time 4   Period Weeks   PT LONG TERM GOAL #4   Title Pt hand grip to increase 10#   Time 4   Period Weeks   PT LONG TERM GOAL #5   Title Pt to be able to verbalize the importance of posture in cervical care    Time 4   Period Weeks               Plan - 12/17/14 1631    Clinical Impression Statement Pain reduced from last session due to new meds given by MD.  Continued with established therex without reports of pain.  Multimodal cues needed for form with therex, especially theraband exericises.  Manual completed in prone to Lt>Rt subscapular, rhomboids and paraspinal musculature.     PT Next Visit Plan Continue with manual PRN, cervical  stabilization and postural strengthening.        Problem List There are no active problems to display for this patient.   Teena Irani, PTA/CLT [phone] 12/17/2014, 4:34 PM  [facility] [address] [place], [place], [address] Phone: [phone]   Fax:  [phone]

## 2014-12-19 ENCOUNTER — Ambulatory Visit (HOSPITAL_COMMUNITY): Payer: BC Managed Care – PPO | Admitting: Physical Therapy

## 2014-12-22 ENCOUNTER — Ambulatory Visit (HOSPITAL_COMMUNITY): Payer: BC Managed Care – PPO | Admitting: Physical Therapy

## 2014-12-22 DIAGNOSIS — R29898 Other symptoms and signs involving the musculoskeletal system: Secondary | ICD-10-CM

## 2014-12-22 DIAGNOSIS — M5382 Other specified dorsopathies, cervical region: Secondary | ICD-10-CM

## 2014-12-22 DIAGNOSIS — G44221 Chronic tension-type headache, intractable: Secondary | ICD-10-CM

## 2014-12-22 DIAGNOSIS — M5412 Radiculopathy, cervical region: Secondary | ICD-10-CM | POA: Diagnosis not present

## 2014-12-22 NOTE — Therapy (Signed)
[place], [place], [address] Phone: [phone]   Fax:  [phone]  Physical Therapy Treatment  Patient Details  Name: [name] MRN: [id number] Date of Birth: [birth year] No Data Recorded  Encounter Date: 12/22/2014      PT End of Session - 12/22/14 1558    Visit Number 8   Number of Visits 12   Date for PT Re-Evaluation 12/20/14   Authorization - Visit Number 8   Authorization - Number of Visits 10   PT Start Time 1520   PT Stop Time 1600   PT Time Calculation (min) 40 min      Past Medical History  Diagnosis Date  . Arthritis     Past Surgical History  Procedure Laterality Date  . Appendectomy      There were no vitals filed for this visit.  Visit Diagnosis:  Cervical radicular pain  Chronic tension-type headache, intractable  Neck muscle weakness  Decreased grip strength of left hand      Subjective Assessment - 12/22/14 1527    Subjective Pt states she has been doing her HEP    Currently in Pain? Yes   Pain Score 3                          [facility] Adult PT Treatment/Exercise - 12/22/14 0001    Neck Exercises: Machines for Strengthening   UBE (Upper Arm Bike) 4' backward    Neck Exercises: Standing   Wall Push Ups 10 reps   Other Standing Exercises chest stretch/ median N stretch x 5 each.    Neck Exercises: Supine   Other Supine Exercise t-band decompression exercises;shoulder flexion; horizontal abduction; external rotation and sash with green t-band    Neck Exercises: Sidelying   Lateral Flexion Both;15 reps   Neck Exercises: Prone   Plank 5 x 15"    Other Prone Exercise cervical extension x 5    Manual Therapy   Manual Therapy Manual Traction   Soft tissue mobilization B upper trapezius    Manual Traction gentle                 PT Education - 12/22/14 1558    Education provided Yes   Education Details t-band supine  deconpression/postural exercises ; forarm planks.    Person(s) Educated Patient   Methods Explanation          PT Short Term Goals - 11/20/14 1629    PT SHORT TERM GOAL #1   Title I HEP   Time 3   Period Days   PT SHORT TERM GOAL #2   Title ROM to be wfl to allow safer driving   Time 2   Period Weeks   PT SHORT TERM GOAL #3   Title Pain level to decrease to a 3  for better life quality   Time 2   Period Weeks   PT SHORT TERM GOAL #4   Title Pt to only be having headaches twice a week    Time 2   Period Weeks   PT SHORT TERM GOAL #5   Title grip strength to increase 5# to allow pt to write for 15 minutes without fatigue   Time 2   Period Weeks           PT Long Term Goals - 11/20/14 1632    PT LONG TERM GOAL #1   Title I advance HEP  Time 4   Period Weeks   PT LONG TERM GOAL #2   Title Strength of cervical mm to increase one grade to allow pain level to decrease to 1/10 80% of the day    Time 4   Period Weeks   PT LONG TERM GOAL #3   Title Pt to be only having one headache a day    Time 4   Period Weeks   PT LONG TERM GOAL #4   Title Pt hand grip to increase 10#   Time 4   Period Weeks   PT LONG TERM GOAL #5   Title Pt to be able to verbalize the importance of posture in cervical care    Time 4   Period Weeks               Plan - 12/22/14 1559    Clinical Impression Statement added plank and supine decompression exercises with t-band to program with good form with verbal cuing.    PT Next Visit Plan progess to sitting t-band D1/D2        Problem List There are no active problems to display for this patient.   Rayetta Humphrey, PT CLT [phone] 12/22/2014, 4:07 PM  [facility] [address] [place], [place], [address] Phone: [phone]   Fax:  [phone]  Name: [name] MRN: [id number] Date of Birth: [birth year]

## 2014-12-24 ENCOUNTER — Ambulatory Visit (HOSPITAL_COMMUNITY): Payer: BC Managed Care – PPO | Admitting: Physical Therapy

## 2014-12-26 ENCOUNTER — Ambulatory Visit (HOSPITAL_COMMUNITY): Payer: BC Managed Care – PPO | Admitting: Physical Therapy

## 2014-12-26 DIAGNOSIS — G44221 Chronic tension-type headache, intractable: Secondary | ICD-10-CM

## 2014-12-26 DIAGNOSIS — M5382 Other specified dorsopathies, cervical region: Secondary | ICD-10-CM

## 2014-12-26 DIAGNOSIS — R29898 Other symptoms and signs involving the musculoskeletal system: Secondary | ICD-10-CM

## 2014-12-26 DIAGNOSIS — M5412 Radiculopathy, cervical region: Secondary | ICD-10-CM

## 2014-12-26 NOTE — Therapy (Addendum)
[facility] [address] [place], [place], [address] Phone: [phone]   Fax:  [phone]  Physical Therapy Treatment  Patient Details  Name: [name] MRN: [id number] Date of Birth: 1955/05/12 No Data Recorded  Encounter Date: 12/26/2014      PT End of Session - 12/26/14 1540    Visit Number 10   Number of Visits 10   Date for PT Re-Evaluation 12/20/14   Authorization - Visit Number 10   Authorization - Number of Visits 10   PT Start Time 1522   PT Stop Time 1602   PT Time Calculation (min) 40 min      Past Medical History  Diagnosis Date  . Arthritis     Past Surgical History  Procedure Laterality Date  . Appendectomy      There were no vitals filed for this visit.  Visit Diagnosis:  Cervical radicular pain  Chronic tension-type headache, intractable  Neck muscle weakness  Decreased grip strength of left hand      Subjective Assessment - 12/26/14 1535    Subjective Pt states she feels better.  Improving with the exercises   Currently in Pain? Yes   Pain Score 2             [facility] PT Assessment - 12/26/14 0001    Assessment   Medical Diagnosis Radicular Cervical pain    Onset Date/Surgical Date 04/08/14   Prior Therapy none for this diagnosis   Precautions   Precautions Cervical   Precaution Comments prominent impingement at C6-7 no activity that increases radiating sx    Required Braces or Orthoses --  none   Restrictions   Weight Bearing Restrictions No   Home Environment   Living Environment Private residence   Prior Function   Level of [place] Retired   Leisure sits with mother   Cognition   Overall Cognitive Status Within Functional Limits for tasks assessed   Observation/Other Assessments   Focus on Therapeutic Outcomes (FOTO)  72   Posture/Postural Control   Posture/Postural Control Postural limitations   Postural Limitations Rounded  Shoulders;Forward head   AROM   Cervical Extension 45  was 28   Cervical - Right Side Bend 38  was 35   Cervical - Left Side Bend 38  was 18   Cervical - Right Rotation 60  was 50    Cervical - Left Rotation 62  was 62   Strength   Left Shoulder Flexion 5/5  was 4+/5   Left Shoulder ABduction 5/5  was 4/5   Left Shoulder Internal Rotation 5/5  was 4/5   Left Shoulder External Rotation 5/5  was 4-/5   Right Hand Grip (lbs) 65  was 58   Left Hand Grip (lbs) 49  was 34   Cervical Extension 5/5  was 3/5   Cervical - Right Side Bend 4/5  was 3+/5   Cervical - Left Side Bend 4-/5  was 3/5                     [facility] Adult PT Treatment/Exercise - 12/26/14 0001    Exercises   Exercises Neck   Neck Exercises: Machines for Strengthening   UBE (Upper Arm Bike) 4' backward    Neck Exercises: Theraband   Other Theraband Exercises standing D1 B x 10    Neck Exercises: Standing   Wall Push Ups 5 reps   Other Standing Exercises chest  stretch/ median N stretch x 5 each.    Neck Exercises: Sidelying   Lateral Flexion Both;15 reps   Neck Exercises: Prone   W Back 15 reps   W Back Weights (lbs) 1   Shoulder Extension 15 reps   Shoulder Extension Weights (lbs) 2   Rows 15 reps   Rows Weights (lbs) 2   Upper Extremity Flexion with Stabilization Flexion   UE Flexion with Stabilization Limitations 1#   Plank 5 x 15"    Other Prone Exercise cervical extension x 5    Manual Therapy   Manual Therapy Manual Traction   Soft tissue mobilization B upper trapezius    Manual Traction gentle       Manual was completed separately from any other treatment this session.              PT Short Term Goals - 12/26/14 1542    PT SHORT TERM GOAL #1   Title I HEP   Time 3   Period --   PT SHORT TERM GOAL #2   Title ROM to be wfl to allow safer driving   Time 2   Period --   PT SHORT TERM GOAL #3   Title Pain level to decrease to a 3  for better life quality   Time 2    Period --   Status --   PT SHORT TERM GOAL #4   Title Pt to only be having headaches twice a week    Time 2   Period --   Status --   PT SHORT TERM GOAL #5   Title grip strength to increase 5# to allow pt to write for 15 minutes without fatigue   Baseline --   Time 2   Period Weeks   Status --           PT Long Term Goals - 12/26/14 1606    PT LONG TERM GOAL #1   Title I advance HEP   Time 4   Period Weeks   Status Achieved   PT LONG TERM GOAL #2   Title Strength of cervical mm to increase one grade to allow pain level to decrease to 1/10 80% of the day    Time 4   Period Weeks   Status On-going   PT LONG TERM GOAL #3   Title Pt to be only having one headache a day    Time 4   Period Weeks   Status Achieved   PT LONG TERM GOAL #4   Title Pt hand grip to increase 10#   Time 4   Period Weeks   Status Achieved   PT LONG TERM GOAL #5   Title Pt to be able to verbalize the importance of posture in cervical care    Time 4   Period Weeks   Status Achieved               Plan - 12/26/14 1617    Clinical Impression Statement Pt reassessed and has met all goals.  Reviewed exercises and form and urged pt to continue HEP    PT Next Visit Plan Discharge pt         Problem List There are no active problems to display for this patient.  Rayetta Humphrey, PT CLT [phone] 12/26/2014, 4:18 PM  [facility] [address] [place], [place], [address] Phone: [phone]   Fax:  [phone]  Name: [name] MRN: [id number]  Date of Birth: 1955/04/14  PHYSICAL THERAPY DISCHARGE SUMMARY  Visits from Start of Care: 10  Current functional level related to goals / functional outcomes: See above   Remaining deficits: Minimal pain   Education / Equipment: HEP  Plan: Patient agrees to discharge.  Patient goals were met. Patient is being discharged due to meeting the stated rehab goals.  ?????   Rayetta Humphrey, [place] CLT [phone]

## 2014-12-29 ENCOUNTER — Ambulatory Visit (HOSPITAL_COMMUNITY): Payer: BC Managed Care – PPO | Admitting: Physical Therapy

## 2014-12-31 ENCOUNTER — Encounter (HOSPITAL_COMMUNITY): Payer: BC Managed Care – PPO | Admitting: Physical Therapy

## 2015-01-02 ENCOUNTER — Encounter (HOSPITAL_COMMUNITY): Payer: BC Managed Care – PPO

## 2015-01-05 ENCOUNTER — Encounter (HOSPITAL_COMMUNITY): Payer: BC Managed Care – PPO | Admitting: Physical Therapy

## 2015-05-01 ENCOUNTER — Other Ambulatory Visit (HOSPITAL_COMMUNITY): Payer: Self-pay | Admitting: Surgical

## 2015-05-01 DIAGNOSIS — M4802 Spinal stenosis, cervical region: Secondary | ICD-10-CM

## 2015-05-04 ENCOUNTER — Ambulatory Visit (HOSPITAL_COMMUNITY): Payer: BC Managed Care – PPO

## 2015-05-19 ENCOUNTER — Ambulatory Visit (HOSPITAL_COMMUNITY): Payer: BC Managed Care – PPO

## 2016-01-11 ENCOUNTER — Encounter (HOSPITAL_COMMUNITY): Payer: Self-pay | Admitting: Physical Therapy

## 2016-01-11 ENCOUNTER — Ambulatory Visit (HOSPITAL_COMMUNITY): Payer: BC Managed Care – PPO | Attending: Rheumatology | Admitting: Physical Therapy

## 2016-01-11 DIAGNOSIS — M545 Low back pain: Secondary | ICD-10-CM | POA: Insufficient documentation

## 2016-01-11 DIAGNOSIS — R29898 Other symptoms and signs involving the musculoskeletal system: Secondary | ICD-10-CM | POA: Diagnosis present

## 2016-01-11 DIAGNOSIS — M25551 Pain in right hip: Secondary | ICD-10-CM | POA: Insufficient documentation

## 2016-01-11 NOTE — Therapy (Signed)
[facility] [address] [place], [place], [address] Phone: [phone]   Fax:  [phone]  Physical Therapy Evaluation  Patient Details  Name: [name] MRN: [id number] Date of Birth: 1955-03-14 Referring Provider: Denman George  Encounter Date: 01/11/2016      PT End of Session - 01/11/16 1555    Visit Number 1   Number of Visits 8   Date for PT Re-Evaluation 01/25/16   Authorization Type BC[employer]   Authorization Time Period 01/11/16 to 02/08/16   PT Start Time 0902   PT Stop Time 0950   PT Time Calculation (min) 48 min   Activity Tolerance Patient tolerated treatment well;No increased pain   Behavior During Therapy WFL for tasks assessed/performed      Past Medical History:  Diagnosis Date  . Arthritis     Past Surgical History:  Procedure Laterality Date  . APPENDECTOMY      There were no vitals filed for this visit.       Subjective Assessment - 01/11/16 0910    Subjective Pt reports LBP that began in mid summer. She saw Dr. Willey Blade who believed it was muscule related. She has been taking pain medication which helps, but she does not want to continue taking it for relief. She went to her rheumatologist who took Xrays and noted some arthritis in her low back. Some days are better than others, with pain about the same since its onset. She has been caring for her son's lab puppy lately which has been bothering her back.    Pertinent History HTN, RA, Cervical fusion, Rt hip pain with injections    Limitations Walking   How long can you sit comfortably? unlimited    How long can you stand comfortably? unsure exact time    How long can you walk comfortably? unsure    Diagnostic tests Xray: arthritis    Patient Stated Goals improve activity tolerance    Currently in Pain? Yes   Pain Score 5    Pain Location Back   Pain Orientation Lower;Right;Left   Pain Descriptors / Indicators Aching   Pain Type Chronic pain    Pain Radiating Towards Rt hip and anterior thigh    Pain Onset More than a month ago   Pain Frequency Intermittent   Aggravating Factors  standing/walking for long periods of time, bending side to side   Pain Relieving Factors sitting, laying down    Effect of Pain on Daily Activities discomfort during activity             [facility] PT Assessment - 01/11/16 0001      Assessment   Medical Diagnosis LBP with Rt side sciatica    Referring Provider Denman George   Onset Date/Surgical Date --  mid summer 2017   Next MD Visit May, earlier if need be   Prior Therapy none      Balance Screen   Has the patient fallen in the past 6 months Yes   How many times? 2  while trying to walk her son's puppy   Has the patient had a decrease in activity level because of a fear of falling?  No   Is the patient reluctant to leave their home because of a fear of falling?  No     Home Environment   Living Environment Private residence   Additional Comments 2-3 STE     Prior Function   Level of Independence Independent  Leisure caring for her son's 59 years old puppy      Cognition   Overall Cognitive Status Within Functional Limits for tasks assessed     Observation/Other Assessments   Focus on Therapeutic Outcomes (FOTO)  39% limitation     Sensation   Light Touch Appears Intact     Posture/Postural Control   Posture/Postural Control Postural limitations   Postural Limitations Rounded Shoulders;Forward head   Posture Comments Supine Rt ASIS inferior Lt; Standing/supine Lt lateral trunk shift      ROM / Strength   AROM / PROM / Strength AROM;Strength     AROM   AROM Assessment Site Lumbar   Lumbar Flexion Stretch pull, WNL   Lumbar Extension pain end range, WNL    Lumbar - Right Side Bend Knee jt line, Pain Rt side   Lumbar - Left Side Bend knee jt line, pain Rt side    Lumbar - Right Rotation pain Rt side   Lumbar - Left Rotation pain free     Strength   Strength Assessment  Site Hip;Knee;Ankle   Right/Left Hip Right;Left   Right Hip Flexion 4/5   Right Hip Extension 3+/5   Right Hip ABduction 4/5   Left Hip Flexion 4/5   Left Hip Extension 3+/5   Left Hip ABduction 4/5   Right/Left Knee Right;Left   Right Knee Flexion 5/5   Right Knee Extension 5/5   Left Knee Flexion 5/5   Left Knee Extension 5/5   Right/Left Ankle Right;Left   Right Ankle Dorsiflexion 5/5   Left Ankle Dorsiflexion 5/5     Palpation   Palpation comment non tender along lumbar paraspinals, tender along Rt iliopsoas, Rt PSIS      Special Tests    Special Tests Sacrolliac Tests;Leg LengthTest   Sacroiliac Tests  --  2 of 5 positive   Leg length test  Apparent     Apparent   Comments Rt>Lt                   [facility] Adult PT Treatment/Exercise - 01/11/16 0001      Manual Therapy   Manual Therapy Myofascial release;Muscle Energy Technique   Myofascial Release TrP release Rt iliacus/psoas   Muscle Energy Technique Lt posterior innominate rotation correction                 PT Education - 01/11/16 1554    Education provided Yes   Education Details eval findings/POC; deferred HEP to next session due to time constraints; implications for manual techniques   Person(s) Educated Patient   Methods Explanation;Demonstration   Comprehension Verbalized understanding;Returned demonstration          PT Short Term Goals - 01/11/16 1808      PT SHORT TERM GOAL #1   Title Pt will demo consistency and independence with HEP to improve strength and ROM.   Time 2   Period Weeks   Status New     PT SHORT TERM GOAL #2   Title Pt will report no greater than 4/10 max pain during daily activity to improve overall quality of life.    Time 3   Status New           PT Long Term Goals - 01/11/16 1810      PT LONG TERM GOAL #1   Title Pt will demo improved BLE strength to 5/5 MMT, to increase her safety with functional tasks.   Time 6   Period Weeks  Status New      PT LONG TERM GOAL #2   Title Pt will demo improved lumbar AROM with no more than 2/10 pain/stretch, to allow her to perform household chores such as sweeping and vacuuming.    Time 6   Period Weeks   Status New     PT LONG TERM GOAL #3   Title Pt will demo improved postural awareness evident by her ability to maintain upright posture without cues from therapist atleast 50% of the time throughout her session.    Time 6   Period Weeks   Status New     PT LONG TERM GOAL #4   Title Pt will demo improved muscle tenderness evident by her ability to perform active SLR x5 reps without repeoduction of Rt hip pain.   Time 6   Period Weeks   Status New               Plan - 01/11/16 1754    Clinical Impression Statement Pt is a 60yo F referred to OPPT concerning LBP with additional symptoms along her low back and Rt anterior thigh/groin area. She denies numbness and tingling as well as bowel/bladder dysfunction and she demonstrates pain with slight limitations in lumbar AROM as well as noted Lt lateral shift during standing and supine positions. Adam's forward bend test appeared to be negative at this time. I was unable to reproduce pain/symptoms with slump and passive straight leg raise testing and overall her LE flexibility is good. Pain was reproduced with palpation of her Rt iliacus/psoas and with active SLR and resisted deep hip flexion in supine. Manual MET was performed to address noted pelvic innominate, however pain was not alleviated until trigger point release was performed along her Rt iliacus. I reviewed findings with the pt and discussed POC with which she verbalized agreement. At this time, she would benefit from skilled PT to address her limitations in trunk stability, LE strength and lumbar ROM to improve her activity tolerance.    Rehab Potential Good   PT Frequency 2x / week   PT Duration 6 weeks   PT Treatment/Interventions ADLs/Self Care Home Management;Moist  Heat;Therapeutic exercise;Therapeutic activities;Functional mobility training;Neuromuscular re-education;Patient/family education;Manual techniques;Passive range of motion;Dry needling   PT Next Visit Plan lateral shift correction; trunk stabilization (ab set, bent knee raise, etc.)   PT Home Exercise Plan next visit   Recommended Other Services none   Consulted and Agree with Plan of Care Patient      Patient will benefit from skilled therapeutic intervention in order to improve the following deficits and impairments:  Decreased activity tolerance, Decreased strength, Impaired flexibility, Postural dysfunction, Pain, Improper body mechanics, Decreased range of motion, Decreased mobility, Increased muscle spasms  Visit Diagnosis: Low back pain, unspecified back pain laterality, unspecified chronicity, with sciatica presence unspecified  Pain in right hip  Other symptoms and signs involving the musculoskeletal system     Problem List There are no active problems to display for this patient.   6:16 PM,01/11/16 [place], DPT [facility] [address] [place], [place], [address] Phone: [phone]   Fax:  [phone]  Name: SHELLA LAHMAN MRN: [id number] Date of Birth: [birth year]

## 2016-01-14 ENCOUNTER — Ambulatory Visit (HOSPITAL_COMMUNITY): Payer: BC Managed Care – PPO | Admitting: Physical Therapy

## 2016-01-14 DIAGNOSIS — M545 Low back pain: Secondary | ICD-10-CM

## 2016-01-14 DIAGNOSIS — M25551 Pain in right hip: Secondary | ICD-10-CM

## 2016-01-14 DIAGNOSIS — R29898 Other symptoms and signs involving the musculoskeletal system: Secondary | ICD-10-CM

## 2016-01-14 NOTE — Therapy (Signed)
[name], [place], [address] Phone: [phone]   Fax:  [phone]  Physical Therapy Treatment  Patient Details  Name: [name] MRN: [id number] Date of Birth: 01/19/1956 Referring Provider: Denman George  Encounter Date: 01/14/2016      PT End of Session - 01/14/16 1548    Visit Number 2   Number of Visits 8   Date for PT Re-Evaluation 01/25/16   Authorization Type BCBS   Authorization Time Period 01/11/16 to 02/08/16   PT Start Time [id number]   PT Stop Time 1555   PT Time Calculation (min) 39 min   Activity Tolerance Patient tolerated treatment well;No increased pain   Behavior During Therapy WFL for tasks assessed/performed      Past Medical History:  Diagnosis Date  . Arthritis     Past Surgical History:  Procedure Laterality Date  . APPENDECTOMY      There were no vitals filed for this visit.      Subjective Assessment - 01/14/16 1520    Subjective Pt reports things are about the same since her initial evaluation. She has no other issues currently. She took an advil around lunch time.    Pertinent History HTN, RA, Cervical fusion, Rt hip pain with injections    Limitations Walking   How long can you sit comfortably? unlimited    How long can you stand comfortably? unsure exact time    How long can you walk comfortably? unsure    Diagnostic tests Xray: arthritis    Patient Stated Goals improve activity tolerance    Currently in Pain? No/denies   Pain Onset --            [facility] PT Assessment - 01/14/16 0001      Special Tests    Special Tests Lumbar   Lumbar Tests other     other   Findings Positive   Comments Adam's forward bend test standing and sitting                      [facility] Adult PT Treatment/Exercise - 01/14/16 0001      Posture/Postural Control   Posture Comments Rt pelvic crest higher than the other; Scoliosis      Exercises   Exercises Lumbar      Lumbar Exercises: Supine   Ab Set 15 reps;5 seconds   Heel Slides 10 reps   Heel Slides Limitations with ab set    Bent Knee Raise 15 reps   Bent Knee Raise Limitations with ab set    Bridge 10 reps   Bridge Limitations x2 sets    Other Supine Lumbar Exercises ab set with bent knee fallout x15 each side                PT Education - 01/14/16 1603    Education provided Yes   Education Details noted scoliosis curve likely contributing to lateral shift; HEP; importance of deep abdominal activation/endurance during daily activity for spine support.    Person(s) Educated Patient   Methods Explanation;Demonstration;Verbal cues;Handout   Comprehension Verbalized understanding;Returned demonstration          PT Short Term Goals - 01/11/16 1808      PT SHORT TERM GOAL #1   Title Pt will demo consistency and independence with HEP to improve strength and ROM.   Time 2   Period Weeks   Status New     PT SHORT  TERM GOAL #2   Title Pt will report no greater than 4/10 max pain during daily activity to improve overall quality of life.    Time 3   Status New           PT Long Term Goals - 01/11/16 1810      PT LONG TERM GOAL #1   Title Pt will demo improved BLE strength to 5/5 MMT, to increase her safety with functional tasks.   Time 6   Period Weeks   Status New     PT LONG TERM GOAL #2   Title Pt will demo improved lumbar AROM with no more than 2/10 pain/stretch, to allow her to perform household chores such as sweeping and vacuuming.    Time 6   Period Weeks   Status New     PT LONG TERM GOAL #3   Title Pt will demo improved postural awareness evident by her ability to maintain upright posture without cues from therapist atleast 50% of the time throughout her session.    Time 6   Period Weeks   Status New     PT LONG TERM GOAL #4   Title Pt will demo improved muscle tenderness evident by her ability to perform active SLR x5 reps without repeoduction of Rt  hip pain.   Time 6   Period Weeks   Status New               Plan - 01/14/16 1548    Clinical Impression Statement Today was [name]'s first treatment following her evaluation. Session focused on implementing HEP with deep abdominal activation with addition of LE movements as she was able to correctly perform. She initially required increased cues for proper technique with over activation of rectus abdominus, however this seemed to be much improved by the end of the session. Ended without report of pain. Will continue with current POC.   Rehab Potential Good   PT Frequency 2x / week   PT Duration 6 weeks   PT Treatment/Interventions ADLs/Self Care Home Management;Moist Heat;Therapeutic exercise;Therapeutic activities;Functional mobility training;Neuromuscular re-education;Patient/family education;Manual techniques;Passive range of motion;Dry needling   PT Next Visit Plan trunk stabilization (ab set, bent knee raise, etc.); postural correction exercises    PT Home Exercise Plan supine ab set hold 10x5 sec (2x); ab set with bent knee fall out x10 each (2x); ab set with bent knee raise x10 (2x)   Consulted and Agree with Plan of Care Patient      Patient will benefit from skilled therapeutic intervention in order to improve the following deficits and impairments:  Decreased activity tolerance, Decreased strength, Impaired flexibility, Postural dysfunction, Pain, Improper body mechanics, Decreased range of motion, Decreased mobility, Increased muscle spasms  Visit Diagnosis: Low back pain, unspecified back pain laterality, unspecified chronicity, with sciatica presence unspecified  Pain in right hip  Other symptoms and signs involving the musculoskeletal system     Problem List There are no active problems to display for this patient.  4:04 PM,01/14/16 [name] PT, DPT [facility] [address] [place], [place], [address] Phone: [phone]   Fax:  [phone]  Name: [name] MRN: [id number] Date of Birth: [birth year]

## 2016-01-18 ENCOUNTER — Ambulatory Visit (HOSPITAL_COMMUNITY): Payer: BC Managed Care – PPO | Admitting: Physical Therapy

## 2016-01-18 DIAGNOSIS — M545 Low back pain: Secondary | ICD-10-CM

## 2016-01-18 DIAGNOSIS — M25551 Pain in right hip: Secondary | ICD-10-CM

## 2016-01-18 DIAGNOSIS — R29898 Other symptoms and signs involving the musculoskeletal system: Secondary | ICD-10-CM

## 2016-01-18 NOTE — Therapy (Addendum)
[facility] [place], [place], [address] Phone: [phone]   Fax:  [phone]  Physical Therapy Treatment/Discharge  Patient Details  Name: [name] MRN: [id number] Date of Birth: 03-Jan-1956 Referring Provider: Denman George  Encounter Date: 01/18/2016      PT End of Session - 01/18/16 1046    Visit Number 3   Number of Visits 8   Date for PT Re-Evaluation 01/25/16   Authorization Type BCBS   Authorization Time Period 01/11/16 to 02/08/16   PT Start Time 1037   PT Stop Time 1115   PT Time Calculation (min) 38 min   Activity Tolerance Patient tolerated treatment well;No increased pain   Behavior During Therapy WFL for tasks assessed/performed      Past Medical History:  Diagnosis Date  . Arthritis     Past Surgical History:  Procedure Laterality Date  . APPENDECTOMY      There were no vitals filed for this visit.      Subjective Assessment - 01/18/16 1040    Subjective Pt reports that her Rt low back is bothering her today. She noticed once she got out of the car. Her hip has not been bothering her lately though.   Pertinent History HTN, RA, Cervical fusion, Rt hip pain with injections    Limitations Walking   How long can you sit comfortably? unlimited    How long can you stand comfortably? unsure exact time    How long can you walk comfortably? unsure    Diagnostic tests Xray: arthritis    Patient Stated Goals improve activity tolerance    Currently in Pain? Other (Comment)  Pain is nothing to worry about, just something she noticed                          [facility] Adult PT Treatment/Exercise - 01/18/16 0001      Exercises   Exercises Other Exercises   Other Exercises  seated trunk derotation with blue TB UE punches x15 each      Lumbar Exercises: Supine   Bent Knee Raise 10 reps   Bent Knee Raise Limitations contralat UE isometric hold x3 sec    Other Supine Lumbar  Exercises ab set with same side knee extension to flexion meeting UE at middle x15 each    Other Supine Lumbar Exercises Ab set with knee at 90 deg flexion, alt lowering 2x5 reps      Lumbar Exercises: Quadruped   Straight Leg Raise 10 reps;Other (comment)  x2 sets with Rt    Straight Leg Raises Limitations (+) trunk twist Rt kick > Lt kick                 PT Education - 01/18/16 1258    Education provided Yes   Education Details updated HEP; technique with therex    Person(s) Educated Patient   Methods Demonstration;Verbal cues;Other (comment);Explanation  emailed copy of HEP   Comprehension Verbalized understanding;Returned demonstration          PT Short Term Goals - 01/11/16 1808      PT SHORT TERM GOAL #1   Title Pt will demo consistency and independence with HEP to improve strength and ROM.   Time 2   Period Weeks   Status New     PT SHORT TERM GOAL #2   Title Pt will report no greater than 4/10 max pain during daily activity to  improve overall quality of life.    Time 3   Status New           PT Long Term Goals - 01/11/16 1810      PT LONG TERM GOAL #1   Title Pt will demo improved BLE strength to 5/5 MMT, to increase her safety with functional tasks.   Time 6   Period Weeks   Status New     PT LONG TERM GOAL #2   Title Pt will demo improved lumbar AROM with no more than 2/10 pain/stretch, to allow her to perform household chores such as sweeping and vacuuming.    Time 6   Period Weeks   Status New     PT LONG TERM GOAL #3   Title Pt will demo improved postural awareness evident by her ability to maintain upright posture without cues from therapist atleast 50% of the time throughout her session.    Time 6   Period Weeks   Status New     PT LONG TERM GOAL #4   Title Pt will demo improved muscle tenderness evident by her ability to perform active SLR x5 reps without repeoduction of Rt hip pain.   Time 6   Period Weeks   Status New                Plan - 01/18/16 1259    Clinical Impression Statement Pt continues to report improvements in pain. Also noting she is able to perform trunk stabilization and strengthening exercise without significant difficulty or reproduction of pain. Updated HEP to reflect these improvements and encouraged her to perform daily to ensure continued progression.   Rehab Potential Good   PT Frequency 2x / week   PT Duration 6 weeks   PT Treatment/Interventions ADLs/Self Care Home Management;Moist Heat;Therapeutic exercise;Therapeutic activities;Functional mobility training;Neuromuscular re-education;Patient/family education;Manual techniques;Passive range of motion;Dry needling   PT Next Visit Plan trunk stabilization (ab set, bent knee raise, etc.); postural correction exercises    PT Home Exercise Plan supine ab set bent knee at 90 deg with alt LE lowering, seated trunk derotation with blue TB, supine ab set with LE/UE flexion isometric.   Recommended Other Services none    Consulted and Agree with Plan of Care Patient      Patient will benefit from skilled therapeutic intervention in order to improve the following deficits and impairments:  Decreased activity tolerance, Decreased strength, Impaired flexibility, Postural dysfunction, Pain, Improper body mechanics, Decreased range of motion, Decreased mobility, Increased muscle spasms  Visit Diagnosis: Low back pain, unspecified back pain laterality, unspecified chronicity, with sciatica presence unspecified  Pain in right hip  Other symptoms and signs involving the musculoskeletal system     Problem List There are no active problems to display for this patient.  1:03 PM,01/18/16 [name] PT, DPT [facility] [place] [address] [place], [place], [address] Phone: [phone]   Fax:  [phone]  Name: [name] MRN:  [id number] Date of Birth: 12-02-1955  PHYSICAL THERAPY DISCHARGE SUMMARY  Visits from Start of Care: 3  Current functional level related to goals / functional outcomes: See above for pt's most recent functional level.    Remaining deficits: See above for pt's most recently stated functional deficits.   Education / Equipment: HEP provided at initial evaluation and updated on her most recent visit. Plan: Patient agrees to discharge.  Patient goals were not met. Patient is  being discharged due to not returning since the last visit.  ?????    4:13 PM,03/17/16 [name] PT, [facility] [phone]

## 2016-01-21 ENCOUNTER — Encounter (HOSPITAL_COMMUNITY): Payer: BC Managed Care – PPO | Admitting: Physical Therapy

## 2016-01-26 ENCOUNTER — Ambulatory Visit (HOSPITAL_COMMUNITY): Payer: BC Managed Care – PPO | Admitting: Physical Therapy

## 2016-01-26 ENCOUNTER — Telehealth (HOSPITAL_COMMUNITY): Payer: Self-pay | Admitting: Physical Therapy

## 2016-01-26 NOTE — Telephone Encounter (Signed)
No Show #1. Attempted to call pt about missed appointment this afternoon. Pt no answer and mailbox was full. Unable to remind pt of next appointment.   4:41 PM,01/26/16 [name] PT, [facility] [phone]

## 2016-02-02 ENCOUNTER — Ambulatory Visit (HOSPITAL_COMMUNITY): Payer: BC Managed Care – PPO | Admitting: Physical Therapy

## 2019-09-04 ENCOUNTER — Encounter (INDEPENDENT_AMBULATORY_CARE_PROVIDER_SITE_OTHER): Payer: Self-pay | Admitting: *Deleted

## 2019-09-30 ENCOUNTER — Other Ambulatory Visit (INDEPENDENT_AMBULATORY_CARE_PROVIDER_SITE_OTHER): Payer: Self-pay | Admitting: *Deleted

## 2019-09-30 ENCOUNTER — Telehealth (INDEPENDENT_AMBULATORY_CARE_PROVIDER_SITE_OTHER): Payer: Self-pay | Admitting: *Deleted

## 2019-09-30 ENCOUNTER — Encounter (INDEPENDENT_AMBULATORY_CARE_PROVIDER_SITE_OTHER): Payer: Self-pay | Admitting: *Deleted

## 2019-09-30 MED ORDER — SUTAB 1479-225-188 MG PO TABS: 1.0000 | ORAL_TABLET | Freq: Once | ORAL | 0 refills | Status: AC

## 2019-09-30 NOTE — Telephone Encounter (Signed)
Patient needs [employer] (copay card) ° °

## 2019-09-30 NOTE — Telephone Encounter (Signed)
Referring MD/PCP: fagan   Procedure: tcs w mac room 1  Reason/Indication:  screening  Has patient had this procedure before?  no  If so, when, by whom and where?    Is there a family history of colon cancer?  no  Who?  What age when diagnosed?    Is patient diabetic?   no      Does patient have prosthetic heart valve or mechanical valve?  no  Do you have a pacemaker/defibrillator?  no  Has patient ever had endocarditis/atrial fibrillation? no  Does patient use oxygen? no  Has patient had joint replacement within last 12 months?  no  Is patient constipated or do they take laxatives? no  Does patient have a history of alcohol/drug use?  no  Is patient on blood thinner such as Coumadin, Plavix and/or Aspirin? no  Medications: lisinopril 10 mg daily, humira 40 mg one pen every other week  Allergies: biaxin, tetracycline  Medication Adjustment per Dr Rehman/Dr Jenetta Downer   Procedure date & time: 10/30/19

## 2019-09-30 NOTE — Telephone Encounter (Signed)
Ok to schedule in room 1.  Thanks,  Harvel Quale, MD Gastroenterology and Hepatology [facility]

## 2019-10-02 ENCOUNTER — Other Ambulatory Visit (INDEPENDENT_AMBULATORY_CARE_PROVIDER_SITE_OTHER): Payer: Self-pay | Admitting: *Deleted

## 2019-10-02 DIAGNOSIS — Z1211 Encounter for screening for malignant neoplasm of colon: Secondary | ICD-10-CM

## 2019-10-16 ENCOUNTER — Encounter (HOSPITAL_COMMUNITY): Admission: RE | Admit: 2019-10-16 | Payer: BC Managed Care – PPO | Source: Ambulatory Visit

## 2019-10-28 ENCOUNTER — Other Ambulatory Visit (HOSPITAL_COMMUNITY): Payer: BC Managed Care – PPO

## 2019-11-14 ENCOUNTER — Encounter (HOSPITAL_COMMUNITY)
Admission: RE | Admit: 2019-11-14 | Discharge: 2019-11-14 | Disposition: A | Discharge status: Home / Self Care | Payer: BC Managed Care – PPO | Source: Ambulatory Visit | Attending: Gastroenterology | Admitting: Gastroenterology

## 2019-11-14 ENCOUNTER — Other Ambulatory Visit: Payer: Self-pay

## 2019-11-25 ENCOUNTER — Other Ambulatory Visit (HOSPITAL_COMMUNITY)
Admission: RE | Admit: 2019-11-25 | Discharge: 2019-11-25 | Disposition: A | Discharge status: Home / Self Care | Payer: BC Managed Care – PPO | Source: Ambulatory Visit | Attending: Gastroenterology | Admitting: Gastroenterology

## 2019-11-27 ENCOUNTER — Encounter (HOSPITAL_COMMUNITY): Admission: RE | Payer: Self-pay | Source: Home / Self Care

## 2019-11-27 ENCOUNTER — Ambulatory Visit (HOSPITAL_COMMUNITY)
Admission: RE | Admit: 2019-11-27 | Payer: BC Managed Care – PPO | Source: Home / Self Care | Admitting: Gastroenterology

## 2019-11-27 SURGERY — COLONOSCOPY WITH PROPOFOL
Anesthesia: Monitor Anesthesia Care

## 2020-03-11 ENCOUNTER — Encounter (HOSPITAL_COMMUNITY): Payer: Self-pay

## 2020-03-11 ENCOUNTER — Other Ambulatory Visit: Payer: Self-pay

## 2020-03-11 ENCOUNTER — Ambulatory Visit (HOSPITAL_COMMUNITY): Payer: BC Managed Care – PPO | Attending: Rheumatology

## 2020-03-11 DIAGNOSIS — M545 Low back pain, unspecified: Secondary | ICD-10-CM | POA: Diagnosis present

## 2020-03-11 DIAGNOSIS — G8929 Other chronic pain: Secondary | ICD-10-CM | POA: Insufficient documentation

## 2020-03-11 DIAGNOSIS — R262 Difficulty in walking, not elsewhere classified: Secondary | ICD-10-CM | POA: Diagnosis present

## 2020-03-11 NOTE — Therapy (Signed)
[place], [place], [address] Phone: [phone]   Fax:  [phone]  Physical Therapy Evaluation  Patient Details  Name: [name] MRN: [id number] Date of Birth: Apr 21, 1955 Referring Provider (PT): Dr. Sherrine Maples Remer Macho   Encounter Date: 03/11/2020   PT End of Session - 03/11/20 1128    Visit Number 1    Number of Visits 12    Date for PT Re-Evaluation 04/22/20    Authorization Type BCBS COMM PPO    Progress Note Due on Visit 10    PT Start Time 1119    PT Stop Time 1205    PT Time Calculation (min) 46 min    Activity Tolerance Patient tolerated treatment well           Past Medical History:  Diagnosis Date  . Arthritis     Past Surgical History:  Procedure Laterality Date  . APPENDECTOMY      There were no vitals filed for this visit.    Subjective Assessment - 03/11/20 1123    Subjective Patient reports progressive LBP with right side pain accompanying it.  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 Reports sitting/laying down somewhat relieves her pain. Reports feeling a "catch" in her back with position changes.    How long can you stand comfortably? 15-20 min    Currently in Pain? Yes    Pain Score 5     Pain Location Back    Pain Orientation Right    Pain Descriptors / Indicators Aching    Pain Type Chronic pain    Pain Radiating Towards Right low back/buttock    Pain Onset More than a month ago    Pain Frequency Intermittent    Aggravating Factors  carrying grandson, position changes              [facility] PT Assessment - 03/11/20 0001      Assessment   Medical Diagnosis Chronic right-side LBP without Sciatica    Referring Provider (PT) Dr. Sherrine Maples [facility]      Balance Screen   Has the patient fallen in the past 6 months No      [place]  residence    Living Arrangements Spouse/significant other    Available Help at Discharge Family    Type of [place]      Prior Function   Level of 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    Leisure walking      Observation/Other Assessments   Observations left lateral sidebend deviation in standing   poor lifting mechanics demonstrated   Focus on Therapeutic Outcomes (FOTO)  47% function      Sensation   Light Touch Appears Intact      Posture/Postural Control   Posture/Postural Control Postural limitations    Postural Limitations Weight shift left      ROM / Strength   AROM / PROM / Strength AROM;Strength      AROM   AROM Assessment Site Lumbar    Lumbar Flexion WNL   left, lateral shift deviation   Lumbar Extension 25% limited    Lumbar - Right Side Bend WNL    Lumbar - Left Side Bend painful right side      Strength  Overall Strength Within functional limits for tasks performed   gross 4/5 BLE   Strength Assessment Site Lumbar    Lumbar Flexion 3-/5   by functional assessment requiring extensive UE use to assume supine to sit     Flexibility   Soft Tissue Assessment /Muscle Length yes    Hamstrings WNL    Quadriceps WNL    Quadratus Lumborum Right side tenderness and hip hike      Palpation   Palpation comment Right QL TTP and notable right hip hike in standing      Special Tests    Special Tests Lumbar    Lumbar Tests FABER test;Prone Knee Bend Test;Straight Leg Raise      FABER test   findings Negative    Side Right   Lt     Prone Knee Bend Test   Findings Negative    Side Right   LT     Straight Leg Raise   Findings Negative    Side  Right   LT     Ambulation/Gait   Ambulation/Gait Yes    Ambulation/Gait Assistance 7: Independent    Ambulation Distance (Feet) 225 Feet    Assistive device None    Gait Pattern Trendelenburg   right  retro-Trendelenberg   Ambulation Surface Level    Gait velocity decreased    Gait Comments                      Objective measurements completed on examination: See above findings.               PT Education - 03/11/20 1209    Education Details Patient education in task modification and body mechanics awareness with emphasis on static standing activities with placing one foot elevated. Postural education in sitting with lumbar support on couch and car.  Training/education in body mechanics for lifting her infant grandson. Education in exam findings and lumbar anatomy    Person(s) Educated Patient    Methods Explanation;Demonstration    Comprehension Verbalized understanding;Need further instruction            PT Short Term Goals - 03/11/20 1227      PT SHORT TERM GOAL #1   Title Pt will demo consistency and independence with HEP to improve strength and ROM.    Time 3    Period Weeks    Status New    Target Date 04/01/20      PT SHORT TERM GOAL #2   Title Pt will report no greater than 3/10 max pain during daily activity to improve overall quality of life.    Baseline 5/10 with movement    Time 3    Period Weeks    Status New    Target Date 04/01/20      PT SHORT TERM GOAL #3   Title Patient will demo proper squat-lift technique 5/5 observations to improve body mechanics and reduce spinal pain    Baseline poor body mechanics and awareness with functional lifts    Time 3    Period Weeks    Status New    Target Date 04/01/20             PT Long Term Goals - 03/11/20 1229      PT LONG TERM GOAL #1   Title Patient will report at least 75% improvement in symptoms for improved quality of life.    Time 6    Period Weeks  Status New    Target Date 04/22/20      PT LONG TERM GOAL #2   Title Pt will demo improved lumbar AROM with no more than 2/10 pain/stretch, to allow her to perform household chores such as sweeping and vacuuming.      Baseline 5/10    Time 6    Period Weeks    Status New    Target Date 04/22/20      PT LONG TERM GOAL #3   Title Patient will improve on FOTO score to meet predicted outcomes to improve functional independence    Baseline 47% function    Time 6    Period Weeks    Status New    Target Date 04/22/20                  Plan - 03/11/20 1222    Clinical Impression Statement Patient is a 65 yo female presenting to physical therapy with c/o chronic right-side LBP. She presents with pain limited deficits in core/trunk strength, spinal ROM deviations, endurance, postural impairments, spinal mobility and functional mobility with ADL. She is having to modify and restrict ADL as indicated by FOTO score as well as subjective information and objective measures which is affecting overall participation. Patient will benefit from skilled physical therapy in order to improve function and reduce impairment.    Personal Factors and Comorbidities Comorbidity 1;Past/Current Experience;Time since onset of injury/illness/exacerbation    Comorbidities RA    Examination-Activity Limitations Bend;Caring for Others;Carry;Squat;Lift;Stand    Examination-Participation Restrictions Community Activity;Driving;Occupation    Stability/Clinical Decision Making Stable/Uncomplicated    Clinical Decision Making Low    Rehab Potential Good    PT Frequency 2x / week    PT Duration 6 weeks    PT Treatment/Interventions ADLs/Self Care Home Management;Aquatic Therapy;Cryotherapy;Electrical Stimulation;Ultrasound;Traction;Moist Heat;DME Instruction;Gait training;Stair training;Functional mobility training;Therapeutic activities;Therapeutic exercise;Patient/family education;Neuromuscular re-education;Balance training;Manual techniques;Taping;Energy conservation;Dry needling;Passive range of motion;Spinal Manipulations;Joint Manipulations    PT Next Visit Plan techniques to correct left lateral shift, core strengthening, lifting  mechanics    PT Home Exercise Plan Prone with pillow support, activity modification, and sitting with lumbar support    Consulted and Agree with Plan of Care Patient           Patient will benefit from skilled therapeutic intervention in order to improve the following deficits and impairments:  Abnormal gait,Decreased activity tolerance,Decreased endurance,Decreased mobility,Decreased range of motion,Difficulty walking,Decreased strength,Hypomobility,Increased muscle spasms,Improper body mechanics,Postural dysfunction,Pain  Visit Diagnosis: Chronic right-sided low back pain without sciatica  Difficulty in walking, not elsewhere classified     Problem List There are no problems to display for this patient.   12:34 PM, 03/11/20 M. Sherlyn Lees, PT, DPT Physical Therapist- [place] Office Number: [phone]  [place] [address] [place], [place], [address] Phone: [phone]   Fax:  [phone]  Name: JEANMARIE SOLLMAN MRN: [id number] Date of Birth: 25-Feb-1956

## 2020-03-11 NOTE — Patient Instructions (Signed)
Prone with pillow support under hips

## 2020-03-18 ENCOUNTER — Other Ambulatory Visit: Payer: Self-pay

## 2020-03-18 ENCOUNTER — Encounter (HOSPITAL_COMMUNITY): Payer: Self-pay

## 2020-03-18 ENCOUNTER — Ambulatory Visit (HOSPITAL_COMMUNITY): Payer: BC Managed Care – PPO

## 2020-03-18 DIAGNOSIS — G8929 Other chronic pain: Secondary | ICD-10-CM

## 2020-03-18 DIAGNOSIS — R262 Difficulty in walking, not elsewhere classified: Secondary | ICD-10-CM

## 2020-03-18 DIAGNOSIS — M545 Low back pain, unspecified: Secondary | ICD-10-CM | POA: Diagnosis not present

## 2020-03-18 NOTE — Patient Instructions (Signed)
Access Code: [email or web address] URL: [email or web address] Date: 03/18/2020 Prepared by: Sherlyn Lees  Exercises Lying Prone - 1 x daily - 7 x weekly Prone Hip Extension with Pillow Under Abdomen - 1 x daily - 7 x weekly - 3 sets - 10 reps Prone Terminal Knee Extension - 1 x daily - 7 x weekly - 3 sets - 10 reps - 2 hold TL Sidebending Stretch - Single Arm Overhead - 1 x daily - 7 x weekly - 3 sets - 5 reps

## 2020-03-18 NOTE — Therapy (Signed)
[place], [place], [address] Phone: [phone]   Fax:  [phone]  Physical Therapy Treatment  Patient Details  Name: [name] MRN: [id number] Date of Birth: Apr 12, 1955 Referring Provider (PT): Dr. Sherrine Maples Remer Macho   Encounter Date: 03/18/2020   PT End of Session - 03/18/20 1413    Visit Number 2    Number of Visits 12    Date for PT Re-Evaluation 04/22/20    Authorization Type BCBS COMM PPO    Progress Note Due on Visit 10    PT Start Time 4315    PT Stop Time 1429    PT Time Calculation (min) 36 min    Activity Tolerance Patient tolerated treatment well           Past Medical History:  Diagnosis Date  . Arthritis     Past Surgical History:  Procedure Laterality Date  . APPENDECTOMY      There were no vitals filed for this visit.   Subjective Assessment - 03/18/20 1357    Subjective Patient reports that she instructed her husband in QL massage with tennis ball which has helped the tenderness in right low back.  Patient reports continued discomfort when having to lift objects from ground and vacuuming. Patient reports that she has been using lumbar support when sitting and this has been helpful    How long can you stand comfortably? 15-20 min    Currently in Pain? Yes    Pain Score 5     Pain Location Back    Pain Orientation Right    Pain Descriptors / Indicators Aching    Pain Type Chronic pain    Pain Onset More than a month ago                             [facility] Adult PT Treatment/Exercise - 03/18/20 0001      Exercises   Exercises Lumbar;Knee/Hip      Lumbar Exercises: Stretches   Single Knee to Chest Stretch Right;Left;3 reps;30 seconds   no restrictions   Other Lumbar Stretch Exercise standing side stretch 3x5 deep breaths left/right      Lumbar Exercises: Prone   Other Prone Lumbar Exercises prone x 2 min      Knee/Hip Exercises: Prone   Hip  Extension Strengthening;2 sets;10 reps;Both   pain with Right hip extension, modified to perform prone terminal knee extension                 PT Education - 03/18/20 1410    Education Details Pt education on benefits of supported prone to facilitate lumbar lordosis and benefits of PRE for hip/lumbar spine to improve stability    Person(s) Educated Patient    Methods Explanation    Comprehension Verbalized understanding            PT Short Term Goals - 03/11/20 1227      PT SHORT TERM GOAL #1   Title Pt will demo consistency and independence with HEP to improve strength and ROM.    Time 3    Period Weeks    Status New    Target Date 04/01/20      PT SHORT TERM GOAL #2   Title Pt will report no greater than 3/10 max pain during daily activity to improve overall quality of life.    Baseline 5/10 with movement  Time 3    Period Weeks    Status New    Target Date 04/01/20      PT SHORT TERM GOAL #3   Title Patient will demo proper squat-lift technique 5/5 observations to improve body mechanics and reduce spinal pain    Baseline poor body mechanics and awareness with functional lifts    Time 3    Period Weeks    Status New    Target Date 04/01/20             PT Long Term Goals - 03/11/20 1229      PT LONG TERM GOAL #1   Title Patient will report at least 75% improvement in symptoms for improved quality of life.    Time 6    Period Weeks    Status New    Target Date 04/22/20      PT LONG TERM GOAL #2   Title Pt will demo improved lumbar AROM with no more than 2/10 pain/stretch, to allow her to perform household chores such as sweeping and vacuuming.     Baseline 5/10    Time 6    Period Weeks    Status New    Target Date 04/22/20      PT LONG TERM GOAL #3   Title Patient will improve on FOTO score to meet predicted outcomes to improve functional independence    Baseline 47% function    Time 6    Period Weeks    Status New    Target Date 04/22/20                  Plan - 03/18/20 1434    Clinical Impression Statement demonstrates right side LBP with extension bias as replicated by hip extension and demo pain referred to right QL and paraspinals requiring activity modification.  Improved performance with standing side stretch without c/o same compressive pain.  No restriction from LE involvement appreciated.  Continued tx to focus on core strength to improve stabilization    Personal Factors and Comorbidities Comorbidity 1;Past/Current Experience;Time since onset of injury/illness/exacerbation    Comorbidities RA    Examination-Activity Limitations Bend;Caring for Others;Carry;Squat;Lift;Stand    Examination-Participation Restrictions Community Activity;Driving;Occupation    Stability/Clinical Decision Making Stable/Uncomplicated    Rehab Potential Good    PT Frequency 2x / week    PT Duration 6 weeks    PT Treatment/Interventions ADLs/Self Care Home Management;Aquatic Therapy;Cryotherapy;Electrical Stimulation;Ultrasound;Traction;Moist Heat;DME Instruction;Gait training;Stair training;Functional mobility training;Therapeutic activities;Therapeutic exercise;Patient/family education;Neuromuscular re-education;Balance training;Manual techniques;Taping;Energy conservation;Dry needling;Passive range of motion;Spinal Manipulations;Joint Manipulations    PT Next Visit Plan techniques to correct left lateral shift, core strengthening, lifting mechanics    PT Home Exercise Plan Prone with pillow support, activity modification, and sitting with lumbar support. Prone hip extension, side stretch    Consulted and Agree with Plan of Care Patient           Patient will benefit from skilled therapeutic intervention in order to improve the following deficits and impairments:  Abnormal gait,Decreased activity tolerance,Decreased endurance,Decreased mobility,Decreased range of motion,Difficulty walking,Decreased strength,Hypomobility,Increased muscle  spasms,Improper body mechanics,Postural dysfunction,Pain  Visit Diagnosis: Chronic right-sided low back pain without sciatica  Difficulty in walking, not elsewhere classified     Problem List There are no problems to display for this patient.  2:37 PM, 03/18/20 M. Sherlyn Lees, PT, DPT Physical Therapist- [place] Office Number: [phone]  [place] [address] [place], [place], [address] Phone: [phone]   Fax:  [phone]  Name: [name] MRN: [id number] Date of Birth: [birth year]

## 2020-03-20 ENCOUNTER — Ambulatory Visit (HOSPITAL_COMMUNITY): Payer: BC Managed Care – PPO

## 2020-03-20 ENCOUNTER — Encounter (HOSPITAL_COMMUNITY): Payer: Self-pay

## 2020-03-20 ENCOUNTER — Other Ambulatory Visit: Payer: Self-pay

## 2020-03-20 DIAGNOSIS — M545 Low back pain, unspecified: Secondary | ICD-10-CM

## 2020-03-20 DIAGNOSIS — R262 Difficulty in walking, not elsewhere classified: Secondary | ICD-10-CM

## 2020-03-20 NOTE — Patient Instructions (Signed)
Access Code: [email or web address] URL: [email or web address] Date: 03/20/2020 Prepared by: Sherlyn Lees  Exercises Shoulder Extension with Resistance - 1 x daily - 7 x weekly - 3 sets - 10 reps Standing Lat Pull Down with Resistance - Elbows Bent - 1 x daily - 7 x weekly - 3 sets - 10 reps Standing Anti-Rotation Press with Anchored Resistance - 1 x daily - 7 x weekly - 3 sets - 10 reps - 2 sec hold Squat with Chair Touch - 1 x daily - 7 x weekly - 3 sets - 10 reps

## 2020-03-20 NOTE — Therapy (Signed)
[name], [place], [address] Phone: [phone]   Fax:  [phone]  Physical Therapy Treatment  Patient Details  Name: [name] MRN: [id number] Date of Birth: June 20, 1955 Referring Provider (PT): Dr. Sherrine Maples Remer Macho   Encounter Date: 03/20/2020   PT End of Session - 03/20/20 1257    Visit Number 3    Number of Visits 12    Date for PT Re-Evaluation 04/22/20    Authorization Type BCBS COMM PPO    Progress Note Due on Visit 10    PT Start Time 1300    PT Stop Time 1340    PT Time Calculation (min) 40 min    Activity Tolerance Patient tolerated treatment well           Past Medical History:  Diagnosis Date  . Arthritis     Past Surgical History:  Procedure Laterality Date  . APPENDECTOMY      There were no vitals filed for this visit.   Subjective Assessment - 03/20/20 1301    Subjective Patient reports that she overall feeling better and increased activities have not caused increase in pain and she has been performing HEP on a daily basis and her husband has continued with tennis ball massage to right QL    How long can you stand comfortably? 15-20 min    Pain Onset More than a month ago                             [facility] Adult PT Treatment/Exercise - 03/20/20 0001      Lumbar Exercises: Aerobic   Stationary Bike level 1 x 5 min      Lumbar Exercises: Standing   Lifting From 12";20 reps   10 lbs, 20 lbs box deadlift for lifting mechanics. lifting 20 lbs from bottom shelf to demo carryover when lifting food bags to reinforce squat mechanics   Row Limitations high row with blue t-band 3x10    Other Standing Lumbar Exercises straight arm pull down w/ ab bracing 3x10 blue t-band    Other Standing Lumbar Exercises paloff press with blue t-band 2x10                  PT Education - 03/20/20 1315    Education Details Pt education on biomechanics and proper  lifting form to reduce spinal stress with good carryover demonstrated    Person(s) Educated Patient    Methods Explanation;Demonstration;Tactile cues;Verbal cues    Comprehension Verbalized understanding;Returned demonstration            PT Short Term Goals - 03/11/20 1227      PT SHORT TERM GOAL #1   Title Pt will demo consistency and independence with HEP to improve strength and ROM.    Time 3    Period Weeks    Status New    Target Date 04/01/20      PT SHORT TERM GOAL #2   Title Pt will report no greater than 3/10 max pain during daily activity to improve overall quality of life.    Baseline 5/10 with movement    Time 3    Period Weeks    Status New    Target Date 04/01/20      PT SHORT TERM GOAL #3   Title Patient will demo proper squat-lift technique 5/5 observations to improve body mechanics and reduce spinal pain  Baseline poor body mechanics and awareness with functional lifts    Time 3    Period Weeks    Status New    Target Date 04/01/20             PT Long Term Goals - 03/11/20 1229      PT LONG TERM GOAL #1   Title Patient will report at least 75% improvement in symptoms for improved quality of life.    Time 6    Period Weeks    Status New    Target Date 04/22/20      PT LONG TERM GOAL #2   Title Pt will demo improved lumbar AROM with no more than 2/10 pain/stretch, to allow her to perform household chores such as sweeping and vacuuming.     Baseline 5/10    Time 6    Period Weeks    Status New    Target Date 04/22/20      PT LONG TERM GOAL #3   Title Patient will improve on FOTO score to meet predicted outcomes to improve functional independence    Baseline 47% function    Time 6    Period Weeks    Status New    Target Date 04/22/20                 Plan - 03/20/20 1340    Clinical Impression Statement Patient demonstrates improved carryover with body mechanics training with verbal and tactile cues for proper form and sequence  to facilitate lifting with legs vs spinal flexion-extension.  Able to tolerate resistance exercises without increasing pain.  Continued tx to focus on lifting and core strengthening    Personal Factors and Comorbidities Comorbidity 1;Past/Current Experience;Time since onset of injury/illness/exacerbation    Comorbidities RA    Examination-Activity Limitations Bend;Caring for Others;Carry;Squat;Lift;Stand    Examination-Participation Restrictions Community Activity;Driving;Occupation    Stability/Clinical Decision Making Stable/Uncomplicated    Rehab Potential Good    PT Frequency 2x / week    PT Duration 6 weeks    PT Treatment/Interventions ADLs/Self Care Home Management;Aquatic Therapy;Cryotherapy;Electrical Stimulation;Ultrasound;Traction;Moist Heat;DME Instruction;Gait training;Stair training;Functional mobility training;Therapeutic activities;Therapeutic exercise;Patient/family education;Neuromuscular re-education;Balance training;Manual techniques;Taping;Energy conservation;Dry needling;Passive range of motion;Spinal Manipulations;Joint Manipulations    PT Next Visit Plan techniques to correct left lateral shift, core strengthening, lifting mechanics    PT Home Exercise Plan Prone with pillow support, activity modification, and sitting with lumbar support. Prone hip extension, side stretch    Consulted and Agree with Plan of Care Patient           Patient will benefit from skilled therapeutic intervention in order to improve the following deficits and impairments:  Abnormal gait,Decreased activity tolerance,Decreased endurance,Decreased mobility,Decreased range of motion,Difficulty walking,Decreased strength,Hypomobility,Increased muscle spasms,Improper body mechanics,Postural dysfunction,Pain  Visit Diagnosis: Chronic right-sided low back pain without sciatica  Difficulty in walking, not elsewhere classified     Problem List There are no problems to display for this  patient.  1:48 PM, 03/20/20 M. Sherlyn Lees, PT, DPT Physical Therapist- [place] Office Number: [phone]  [facility] [address] [place], [place], [address] Phone: [phone]   Fax:  [phone]  Name: [name] MRN: [id number] Date of Birth: 06/09/1955

## 2020-03-25 ENCOUNTER — Ambulatory Visit (HOSPITAL_COMMUNITY): Payer: BC Managed Care – PPO | Admitting: Physical Therapy

## 2020-03-27 ENCOUNTER — Telehealth (HOSPITAL_COMMUNITY): Payer: Self-pay

## 2020-03-27 ENCOUNTER — Ambulatory Visit (HOSPITAL_COMMUNITY): Payer: BC Managed Care – PPO

## 2020-03-27 NOTE — Telephone Encounter (Signed)
Called, left message about missed appointment and date/time for next appointment.  12:54 PM, 03/27/20 M. Sherlyn Lees, PT, DPT Physical Therapist- [place] Office Number: [phone]

## 2020-04-01 ENCOUNTER — Telehealth (HOSPITAL_COMMUNITY): Payer: Self-pay | Admitting: Physical Therapy

## 2020-04-01 ENCOUNTER — Ambulatory Visit (HOSPITAL_COMMUNITY): Payer: BC Managed Care – PPO | Admitting: Physical Therapy

## 2020-04-01 NOTE — Telephone Encounter (Signed)
l/m to cx she is waiting on a call back from her MD - she plans to be here on Friday

## 2020-04-01 NOTE — Telephone Encounter (Signed)
Pt did not show for appt.  STates she called and left a voicemail to cancel this appt as well as the one on 1/21.  Pt reminded of appt on Friday and states she will let us know if she cannot make this one.  STates her back has worsened and awaiting return call from MD regarding further xrays/studies.   Teena Irani, PTA/CLT [phone]

## 2020-04-02 ENCOUNTER — Telehealth (HOSPITAL_COMMUNITY): Payer: Self-pay | Admitting: Physical Therapy

## 2020-04-02 NOTE — Telephone Encounter (Signed)
Pt woke up with a fever and will call the MD today-she wanted to cx this apptment.

## 2020-04-03 ENCOUNTER — Encounter (HOSPITAL_COMMUNITY): Payer: BC Managed Care – PPO | Admitting: Physical Therapy

## 2020-04-08 ENCOUNTER — Ambulatory Visit (HOSPITAL_COMMUNITY): Payer: BC Managed Care – PPO

## 2020-04-10 ENCOUNTER — Ambulatory Visit (HOSPITAL_COMMUNITY): Payer: BC Managed Care – PPO | Attending: Rheumatology

## 2020-04-15 ENCOUNTER — Ambulatory Visit (HOSPITAL_COMMUNITY): Payer: BC Managed Care – PPO

## 2020-04-17 ENCOUNTER — Telehealth (HOSPITAL_COMMUNITY): Payer: Self-pay

## 2020-04-17 ENCOUNTER — Ambulatory Visit (HOSPITAL_COMMUNITY): Payer: BC Managed Care – PPO

## 2020-04-17 NOTE — Telephone Encounter (Signed)
No show, called and left message concerning missed apt today.  Included next scheduled apt date and time with contact number included for questions and if needs to cancel/reschedule.  Noted pt has not been to therapy since 1/14, asked in message if wishes to DC from therapy.  Ihor Austin, LPTA/CLT; Delana Meyer [phone]

## 2020-04-21 ENCOUNTER — Ambulatory Visit (HOSPITAL_COMMUNITY): Payer: BC Managed Care – PPO

## 2020-04-21 ENCOUNTER — Encounter (HOSPITAL_COMMUNITY): Payer: Self-pay

## 2020-04-21 NOTE — Therapy (Signed)
[place], [place], [address] Phone: [phone]   Fax:  [phone]  Patient Details  Name: [name] MRN: [id number] Date of Birth: 1955/07/25 Referring Provider:  No ref. provider found  Encounter Date: 04/21/2020  PHYSICAL THERAPY DISCHARGE SUMMARY  Visits from Start of Care: 3  Current functional level related to goals / functional outcomes: Unable to assess.  Patient did not return after 3rd visit 03/20/20   Remaining deficits: N/A   Education / Equipment: Provided with initial HEP and education/demonstration in [employer] Plan: Patient agrees to discharge.  Patient goals were not met. Patient is being discharged due to not returning since the last visit.  ?????       11:34 AM, 04/21/20 M. Sherlyn Lees, PT, DPT Physical Therapist- [place] Office Number: [phone]  [place] [address] [place], [place], [address] Phone: [phone]   Fax:  [phone]

## 2020-04-23 ENCOUNTER — Ambulatory Visit (HOSPITAL_COMMUNITY): Payer: BC Managed Care – PPO | Admitting: Physical Therapy

## 2020-07-02 ENCOUNTER — Other Ambulatory Visit: Payer: BC Managed Care – PPO | Admitting: Obstetrics & Gynecology

## 2021-04-30 ENCOUNTER — Ambulatory Visit
Admission: EM | Admit: 2021-04-30 | Discharge: 2021-04-30 | Disposition: A | Discharge status: Home / Self Care | Payer: Medicare PPO | Attending: Family Medicine | Admitting: Family Medicine

## 2021-04-30 ENCOUNTER — Other Ambulatory Visit: Payer: Self-pay

## 2021-04-30 ENCOUNTER — Ambulatory Visit (HOSPITAL_COMMUNITY)
Admission: RE | Admit: 2021-04-30 | Discharge: 2021-04-30 | Disposition: A | Discharge status: Home / Self Care | Payer: Medicare PPO | Source: Ambulatory Visit | Attending: Internal Medicine | Admitting: Internal Medicine

## 2021-04-30 ENCOUNTER — Encounter: Payer: Self-pay | Admitting: Emergency Medicine

## 2021-04-30 ENCOUNTER — Other Ambulatory Visit (HOSPITAL_COMMUNITY): Payer: Self-pay | Admitting: Internal Medicine

## 2021-04-30 DIAGNOSIS — J22 Unspecified acute lower respiratory infection: Secondary | ICD-10-CM

## 2021-04-30 DIAGNOSIS — R059 Cough, unspecified: Secondary | ICD-10-CM

## 2021-04-30 MED ORDER — GUAIFENESIN ER 600 MG PO TB12: 600.0000 mg | ORAL_TABLET | Freq: Two times a day (BID) | ORAL | 0 refills | Status: DC | PRN

## 2021-04-30 MED ORDER — PREDNISONE 20 MG PO TABS: 40.0000 mg | ORAL_TABLET | Freq: Every day | ORAL | 0 refills | Status: DC

## 2021-04-30 MED ORDER — ALBUTEROL SULFATE HFA 108 (90 BASE) MCG/ACT IN AERS: 1.0000 | INHALATION_SPRAY | Freq: Four times a day (QID) | RESPIRATORY_TRACT | 0 refills | Status: DC | PRN

## 2021-04-30 NOTE — ED Provider Notes (Signed)
[facility]    CSN: [id number] Arrival date & time: 04/30/21  1635      History   Chief Complaint No chief complaint on file.  HPI [name] is a 66 y.o. female.   Presenting today with 1 week history of productive cough, fever, chills, chest tightness, shortness of breath, scratchy throat, fatigue.  Denies abdominal pain, nausea, vomiting, diarrhea, rashes.  2 home COVID test have been negative so far.  She states that she has been working with her PCP on this who called in cefdinir and Tessalon on Tuesday which did help some with symptoms and she is overall feeling better but still having shortness of breath, fatigue and cough.  She had a chest x-ray done today via PCP office but has not received the results of this yet.  No past history of pulmonary disease that she is aware of.  Past Medical History:  Diagnosis Date   Arthritis    There are no problems to display for this patient.   Past Surgical History:  Procedure Laterality Date   APPENDECTOMY      OB History   No obstetric history on file.      Home Medications    Prior to Admission medications   Medication Sig Start Date End Date Taking? Authorizing Provider  albuterol (VENTOLIN HFA) 108 (90 Base) MCG/ACT inhaler Inhale 1-2 puffs into the lungs every 6 (six) hours as needed for wheezing or shortness of breath. 04/30/21  Yes Volney American, PA-C  guaiFENesin (MUCINEX) 600 MG 12 hr tablet Take 1 tablet (600 mg total) by mouth 2 (two) times daily as needed. 04/30/21  Yes Volney American, PA-C  predniSONE (DELTASONE) 20 MG tablet Take 2 tablets (40 mg total) by mouth daily with breakfast. 04/30/21  Yes Volney American, PA-C  acetaminophen (TYLENOL) 500 MG tablet Take 1,000 mg by mouth every 6 (six) hours as needed (for pain.).    [provider]  Aspirin-Acetaminophen (GOODYS BODY PAIN PO) Take 1 packet by mouth daily as needed (headaches/hip pain.). Hip pain related to  RA     [provider]  HUMIRA PEN 40 MG/0.8ML PNKT Inject 40 mg into the skin every 14 (fourteen) days.  08/02/19   [provider]  ibuprofen (ADVIL) 200 MG tablet Take 400 mg by mouth every 8 (eight) hours as needed (for pain.).    [provider]  lisinopril (PRINIVIL,ZESTRIL) 10 MG tablet Take 10 mg by mouth daily.    [provider]    Family History History reviewed. No pertinent family history.  Social History Social History   Tobacco Use   Smoking status: Never  Substance Use Topics   Alcohol use: Yes    Comment: Occ     Allergies   Biaxin [clarithromycin], Codeine, and Tetracyclines & related   Review of Systems Review of Systems Per HPI  Physical Exam Triage Vital Signs ED Triage Vitals  Enc Vitals Group     BP 04/30/21 1640 135/89     Pulse Rate 04/30/21 1640 97     Resp 04/30/21 1640 18     Temp 04/30/21 1640 (!) 97.4 F (36.3 C)     Temp Source 04/30/21 1640 Oral     SpO2 04/30/21 1640 95 %     Weight --      Height --      Head Circumference --      Peak Flow --      Pain Score  04/30/21 1643 0     Pain Loc --      Pain Edu? --      Excl. in [place]? --    No data found.  Updated Vital Signs BP 135/89 (BP Location: Right Arm)    Pulse 97    Temp (!) 97.4 F (36.3 C) (Oral)    Resp 18    SpO2 95%   Visual Acuity Right Eye Distance:   Left Eye Distance:   Bilateral Distance:    Right Eye Near:   Left Eye Near:    Bilateral Near:     Physical Exam Vitals and nursing note reviewed.  Constitutional:      Appearance: Normal appearance. She is not ill-appearing.  HENT:     Head: Atraumatic.     Right Ear: Tympanic membrane and external ear normal.     Left Ear: Tympanic membrane and external ear normal.     Nose: Congestion present.     Mouth/Throat:     Mouth: Mucous membranes are moist.     Pharynx: Posterior oropharyngeal erythema present.  Eyes:     Extraocular Movements: Extraocular movements intact.      Conjunctiva/sclera: Conjunctivae normal.  Cardiovascular:     Rate and Rhythm: Normal rate and regular rhythm.     Heart sounds: Normal heart sounds.  Pulmonary:     Effort: Pulmonary effort is normal.     Breath sounds: Wheezing present. No rales.     Comments: Mild to moderate scattered wheezes throughout.  Speaking in full sentences, breathing comfortably on room air Musculoskeletal:        General: Normal range of motion.     Cervical back: Normal range of motion and neck supple.  Skin:    General: Skin is warm and dry.  Neurological:     Mental Status: She is alert and oriented to person, place, and time.  Psychiatric:        Mood and Affect: Mood normal.        Thought Content: Thought content normal.        Judgment: Judgment normal.     UC Treatments / Results  Labs (all labs ordered are listed, but only abnormal results are displayed) Labs Reviewed - No data to display  EKG   Radiology No results found.  Procedures Procedures (including critical care time)  Medications Ordered in UC Medications - No data to display  Initial Impression / Assessment and Plan / UC Course  I have reviewed the triage vital signs and the nursing notes.  Pertinent labs & imaging results that were available during my care of the patient were reviewed by me and considered in my medical decision making (see chart for details).     Suspect some bronchitis causing her persisting symptoms.  Already on cefdinir, complete this course and will add prednisone, albuterol inhaler and Mucinex.  Discussed supportive over-the-counter medications and home care additionally.  Return for any acutely worsening symptoms  Final Clinical Impressions(s) / UC Diagnoses   Final diagnoses:  Lower respiratory infection   Discharge Instructions   None    ED Prescriptions     Medication Sig Dispense Auth. Provider   predniSONE (DELTASONE) 20 MG tablet Take 2 tablets (40 mg total) by mouth daily  with breakfast. 10 tablet Volney American, PA-C   albuterol (VENTOLIN HFA) 108 (90 Base) MCG/ACT inhaler Inhale 1-2 puffs into the lungs every 6 (six) hours as needed for wheezing or shortness of breath. [place]  g Volney American, PA-C   guaiFENesin (MUCINEX) 600 MG 12 hr tablet Take 1 tablet (600 mg total) by mouth 2 (two) times daily as needed. 30 tablet Volney American, [place]      PDMP not reviewed this encounter.   Volney American, [place] 04/30/21 1754

## 2021-04-30 NOTE — ED Triage Notes (Signed)
Cough and congestion since last Friday.  Symptoms have become worse over the week.  Home covid test was negative on Monday.  PCP called in cefdinir and benzonate on Tuesday.    Second covid test on Wednesday was negative.  Chest x-ray was done today.

## 2021-05-26 ENCOUNTER — Encounter (HOSPITAL_COMMUNITY): Payer: Self-pay | Admitting: Radiology

## 2021-06-01 ENCOUNTER — Other Ambulatory Visit: Payer: Self-pay

## 2021-06-01 ENCOUNTER — Other Ambulatory Visit (HOSPITAL_COMMUNITY): Payer: Self-pay | Admitting: Internal Medicine

## 2021-06-01 ENCOUNTER — Ambulatory Visit (HOSPITAL_COMMUNITY)
Admission: RE | Admit: 2021-06-01 | Discharge: 2021-06-01 | Disposition: A | Discharge status: Home / Self Care | Payer: Medicare PPO | Source: Ambulatory Visit | Attending: Internal Medicine | Admitting: Internal Medicine

## 2021-06-01 DIAGNOSIS — R918 Other nonspecific abnormal finding of lung field: Secondary | ICD-10-CM | POA: Diagnosis not present

## 2021-08-18 DIAGNOSIS — H2513 Age-related nuclear cataract, bilateral: Secondary | ICD-10-CM | POA: Diagnosis not present

## 2021-08-18 DIAGNOSIS — H524 Presbyopia: Secondary | ICD-10-CM | POA: Diagnosis not present

## 2021-10-06 DIAGNOSIS — M5416 Radiculopathy, lumbar region: Secondary | ICD-10-CM | POA: Diagnosis not present

## 2021-10-06 DIAGNOSIS — M4310 Spondylolisthesis, site unspecified: Secondary | ICD-10-CM | POA: Diagnosis not present

## 2021-10-06 DIAGNOSIS — M4150 Other secondary scoliosis, site unspecified: Secondary | ICD-10-CM | POA: Diagnosis not present

## 2021-10-27 DIAGNOSIS — Z5181 Encounter for therapeutic drug level monitoring: Secondary | ICD-10-CM | POA: Diagnosis not present

## 2021-10-27 DIAGNOSIS — Z79899 Other long term (current) drug therapy: Secondary | ICD-10-CM | POA: Diagnosis not present

## 2021-10-27 DIAGNOSIS — M0579 Rheumatoid arthritis with rheumatoid factor of multiple sites without organ or systems involvement: Secondary | ICD-10-CM | POA: Diagnosis not present

## 2021-11-18 DIAGNOSIS — M5416 Radiculopathy, lumbar region: Secondary | ICD-10-CM | POA: Diagnosis not present

## 2021-11-18 DIAGNOSIS — M4306 Spondylolysis, lumbar region: Secondary | ICD-10-CM | POA: Diagnosis not present

## 2021-11-26 DIAGNOSIS — M21611 Bunion of right foot: Secondary | ICD-10-CM | POA: Diagnosis not present

## 2021-11-26 DIAGNOSIS — M2012 Hallux valgus (acquired), left foot: Secondary | ICD-10-CM | POA: Diagnosis not present

## 2021-11-26 DIAGNOSIS — M2142 Flat foot [pes planus] (acquired), left foot: Secondary | ICD-10-CM | POA: Diagnosis not present

## 2021-11-26 DIAGNOSIS — M19072 Primary osteoarthritis, left ankle and foot: Secondary | ICD-10-CM | POA: Diagnosis not present

## 2021-11-26 DIAGNOSIS — M069 Rheumatoid arthritis, unspecified: Secondary | ICD-10-CM | POA: Diagnosis not present

## 2022-01-05 DIAGNOSIS — Z23 Encounter for immunization: Secondary | ICD-10-CM | POA: Diagnosis not present

## 2022-02-02 ENCOUNTER — Encounter (INDEPENDENT_AMBULATORY_CARE_PROVIDER_SITE_OTHER): Payer: Self-pay | Admitting: *Deleted

## 2022-03-04 ENCOUNTER — Ambulatory Visit (HOSPITAL_COMMUNITY)
Admission: RE | Admit: 2022-03-04 | Discharge: 2022-03-04 | Disposition: A | Discharge status: Home / Self Care | Payer: Medicare PPO | Source: Ambulatory Visit | Attending: Internal Medicine | Admitting: Internal Medicine

## 2022-03-04 ENCOUNTER — Other Ambulatory Visit (HOSPITAL_COMMUNITY): Payer: Self-pay | Admitting: Internal Medicine

## 2022-03-04 DIAGNOSIS — M542 Cervicalgia: Secondary | ICD-10-CM | POA: Insufficient documentation

## 2022-03-04 DIAGNOSIS — M25561 Pain in right knee: Secondary | ICD-10-CM | POA: Diagnosis not present

## 2022-03-04 DIAGNOSIS — M1711 Unilateral primary osteoarthritis, right knee: Secondary | ICD-10-CM | POA: Diagnosis not present

## 2022-03-10 ENCOUNTER — Telehealth (INDEPENDENT_AMBULATORY_CARE_PROVIDER_SITE_OTHER): Payer: Self-pay | Admitting: *Deleted

## 2022-03-10 NOTE — Telephone Encounter (Signed)
Referring MD/PCP: Dr. Willey Blade  Procedure: Colonoscopy  Reason/Indication:  screening   Has patient had this procedure before?  no  If so, when, by whom and where?    Is there a family history of colon cancer?  no  Who?  What age when diagnosed?    Is patient diabetic? If yes, Type 1 or Type 2   no      Does patient have prosthetic heart valve or mechanical valve?  no  Do you have a pacemaker/defibrillator?  no  Has patient ever had endocarditis/atrial fibrillation? no  Does patient use oxygen? no  Has patient had joint replacement within last 12 months?  no  Is patient constipated or do they take laxatives? no  Does patient have a history of alcohol/drug use?  no  Have you had a stroke/heart attack last 6 mths? no  Do you take medicine for weight loss?  no  For female patients,: have you had a hysterectomy yes                      are you post menopausal yes                      do you still have your menstrual cycle no  Is patient on blood thinner such as Coumadin, Plavix and/or Aspirin? no  Medications:  Current Outpatient Medications on File Prior to Visit  Medication Sig Dispense Refill   HUMIRA PEN 40 MG/0.8ML PNKT Inject 40 mg into the skin every 14 (fourteen) days.      lisinopril (PRINIVIL,ZESTRIL) 10 MG tablet Take 10 mg by mouth daily.     No current facility-administered medications on file prior to visit.     Allergies:  Allergies  Allergen Reactions   Biaxin [Clarithromycin] Hives   Codeine Nausea Only   Tetracyclines & Related Hives     [facility]

## 2022-03-22 NOTE — Telephone Encounter (Signed)
Any room Thanks 

## 2022-03-23 MED ORDER — PEG 3350-KCL-NA BICARB-NACL 420 G PO SOLR
4000.0000 mL | Freq: Once | ORAL | 0 refills | Status: AC
Start: 2022-03-23 — End: 2022-03-23

## 2022-03-23 NOTE — Addendum Note (Signed)
Addended by: Cheron Every on: 03/23/2022 01:40 PM   Modules accepted: Orders

## 2022-03-23 NOTE — Telephone Encounter (Signed)
PA approved via cohere. Auth# [id number], DOS:  04/05/2022 - 07/05/2022

## 2022-03-23 NOTE — Telephone Encounter (Signed)
Pt called back. Scheduled for 1/30 at 12:45pm. Aware will send rx to pharmacy and mail instructions.

## 2022-03-23 NOTE — Telephone Encounter (Signed)
LMOVM to call back 

## 2022-03-28 ENCOUNTER — Encounter (INDEPENDENT_AMBULATORY_CARE_PROVIDER_SITE_OTHER): Payer: Self-pay | Admitting: *Deleted

## 2022-03-28 NOTE — Telephone Encounter (Signed)
Referral completed

## 2022-04-01 ENCOUNTER — Encounter (HOSPITAL_COMMUNITY): Payer: Self-pay

## 2022-04-01 ENCOUNTER — Encounter (HOSPITAL_COMMUNITY)
Admission: RE | Admit: 2022-04-01 | Discharge: 2022-04-01 | Disposition: A | Discharge status: Home / Self Care | Payer: Medicare PPO | Source: Ambulatory Visit | Attending: Gastroenterology | Admitting: Gastroenterology

## 2022-04-01 HISTORY — DX: Essential (primary) hypertension: I10

## 2022-04-01 HISTORY — DX: Other complications of anesthesia, initial encounter: T88.59XA

## 2022-04-01 HISTORY — DX: Other pulmonary embolism without acute cor pulmonale: I26.99

## 2022-04-05 ENCOUNTER — Encounter (INDEPENDENT_AMBULATORY_CARE_PROVIDER_SITE_OTHER): Payer: Self-pay | Admitting: *Deleted

## 2022-04-05 ENCOUNTER — Encounter (HOSPITAL_COMMUNITY)
Admission: RE | Disposition: A | Discharge status: Home / Self Care | Payer: Self-pay | Source: Home / Self Care | Attending: Gastroenterology

## 2022-04-05 ENCOUNTER — Ambulatory Visit (HOSPITAL_BASED_OUTPATIENT_CLINIC_OR_DEPARTMENT_OTHER): Payer: Medicare PPO | Admitting: Anesthesiology

## 2022-04-05 ENCOUNTER — Ambulatory Visit (HOSPITAL_COMMUNITY): Payer: Medicare PPO | Admitting: Anesthesiology

## 2022-04-05 ENCOUNTER — Ambulatory Visit (HOSPITAL_COMMUNITY)
Admission: RE | Admit: 2022-04-05 | Discharge: 2022-04-05 | Disposition: A | Discharge status: Home / Self Care | Payer: Medicare PPO | Attending: Gastroenterology | Admitting: Gastroenterology

## 2022-04-05 ENCOUNTER — Encounter (HOSPITAL_COMMUNITY): Payer: Self-pay | Admitting: Gastroenterology

## 2022-04-05 DIAGNOSIS — Z86711 Personal history of pulmonary embolism: Secondary | ICD-10-CM | POA: Diagnosis not present

## 2022-04-05 DIAGNOSIS — Z79899 Other long term (current) drug therapy: Secondary | ICD-10-CM | POA: Insufficient documentation

## 2022-04-05 DIAGNOSIS — I10 Essential (primary) hypertension: Secondary | ICD-10-CM | POA: Insufficient documentation

## 2022-04-05 DIAGNOSIS — K5730 Diverticulosis of large intestine without perforation or abscess without bleeding: Secondary | ICD-10-CM | POA: Diagnosis not present

## 2022-04-05 DIAGNOSIS — Q438 Other specified congenital malformations of intestine: Secondary | ICD-10-CM | POA: Insufficient documentation

## 2022-04-05 DIAGNOSIS — Z1211 Encounter for screening for malignant neoplasm of colon: Secondary | ICD-10-CM

## 2022-04-05 DIAGNOSIS — M1990 Unspecified osteoarthritis, unspecified site: Secondary | ICD-10-CM | POA: Diagnosis not present

## 2022-04-05 DIAGNOSIS — K635 Polyp of colon: Secondary | ICD-10-CM | POA: Diagnosis not present

## 2022-04-05 DIAGNOSIS — D125 Benign neoplasm of sigmoid colon: Secondary | ICD-10-CM | POA: Diagnosis not present

## 2022-04-05 DIAGNOSIS — K648 Other hemorrhoids: Secondary | ICD-10-CM | POA: Diagnosis not present

## 2022-04-05 DIAGNOSIS — Z139 Encounter for screening, unspecified: Secondary | ICD-10-CM | POA: Diagnosis not present

## 2022-04-05 HISTORY — PX: POLYPECTOMY: SHX5525

## 2022-04-05 HISTORY — PX: COLONOSCOPY WITH PROPOFOL: SHX5780

## 2022-04-05 LAB — HM COLONOSCOPY

## 2022-04-05 SURGERY — COLONOSCOPY WITH PROPOFOL
Anesthesia: General

## 2022-04-05 MED ORDER — PROPOFOL 500 MG/50ML IV EMUL
INTRAVENOUS | Status: DC | PRN
  Administered 2022-04-05: 110 ug/kg/min via INTRAVENOUS

## 2022-04-05 MED ORDER — LACTATED RINGERS IV SOLN
INTRAVENOUS | Status: DC
  Administered 2022-04-05: 1000 mL via INTRAVENOUS

## 2022-04-05 MED ORDER — STERILE WATER FOR IRRIGATION IR SOLN
Status: DC | PRN
  Administered 2022-04-05: 60 mL

## 2022-04-05 MED ORDER — LIDOCAINE HCL (CARDIAC) PF 50 MG/5ML IV SOSY
PREFILLED_SYRINGE | INTRAVENOUS | Status: DC | PRN
  Administered 2022-04-05: 80 mg via INTRAVENOUS

## 2022-04-05 MED ORDER — PROPOFOL 10 MG/ML IV BOLUS
INTRAVENOUS | Status: DC | PRN
  Administered 2022-04-05: 100 mg via INTRAVENOUS
  Administered 2022-04-05: 60 mg via INTRAVENOUS
  Administered 2022-04-05: 40 mg via INTRAVENOUS

## 2022-04-05 NOTE — Anesthesia Procedure Notes (Signed)
Date/Time: 04/05/2022 11:05 AM  Performed by: Vista Deck, CRNAPre-anesthesia Checklist: Patient identified, Emergency Drugs available, Suction available, Timeout performed and Patient being monitored Patient Re-evaluated:Patient Re-evaluated prior to induction Oxygen Delivery Method: Nasal Cannula

## 2022-04-05 NOTE — Anesthesia Postprocedure Evaluation (Signed)
Anesthesia Post Note  Patient: [name]  Procedure(s) Performed: COLONOSCOPY WITH PROPOFOL POLYPECTOMY  Patient location during evaluation: Phase II Anesthesia Type: General Level of consciousness: awake and alert and oriented Pain management: pain level controlled Vital Signs Assessment: post-procedure vital signs reviewed and stable Respiratory status: spontaneous breathing, nonlabored ventilation and respiratory function stable Cardiovascular status: blood pressure returned to baseline and stable Postop Assessment: no apparent nausea or vomiting Anesthetic complications: no  No notable events documented.   Last Vitals:  Vitals:   04/05/22 1057 04/05/22 1149  BP: (!) 148/88 115/87  Pulse:  81  Resp: 16 20  Temp: 36.6 C 36.4 C  SpO2: 98% 98%    Last Pain:  Vitals:   04/05/22 1149  TempSrc: Oral  PainSc: 0-No pain                 Harsha Yusko C Kingdom [place]

## 2022-04-05 NOTE — Discharge Instructions (Signed)
You are being discharged to home.  Resume your previous diet.  We are waiting for your pathology results.  Your physician has recommended a repeat colonoscopy in 10 years for screening purposes.

## 2022-04-05 NOTE — Anesthesia Preprocedure Evaluation (Signed)
Anesthesia Evaluation  Patient identified by MRN, date of birth, ID band Patient awake    Reviewed: Allergy & Precautions, H&P , NPO status , Patient's Chart, lab work & pertinent test results  History of Anesthesia Complications (+) history of anesthetic complications (low blood pressure)  Airway Mallampati: II  TM Distance: >3 FB Neck ROM: Limited   Comment: Cervical fusion Bony prominence on upper palate Dental  (+) Dental Advisory Given, Teeth Intact   Pulmonary PE   Pulmonary exam normal breath sounds clear to auscultation       Cardiovascular hypertension, Pt. on medications Normal cardiovascular exam Rhythm:Regular Rate:Normal     Neuro/Psych negative neurological ROS  negative psych ROS   GI/Hepatic negative GI ROS, Neg liver ROS,,,  Endo/Other  negative endocrine ROS    Renal/GU negative Renal ROS  negative genitourinary   Musculoskeletal  (+) Arthritis , Osteoarthritis,    Abdominal   Peds negative pediatric ROS (+)  Hematology negative hematology ROS (+)   Anesthesia Other Findings   Reproductive/Obstetrics negative OB ROS                             Anesthesia Physical Anesthesia Plan  ASA: 2  Anesthesia Plan: General   Post-op Pain Management: Minimal or no pain anticipated   Induction: Intravenous  PONV Risk Score and Plan: 1 and Propofol infusion  Airway Management Planned: Nasal Cannula and Natural Airway  Additional Equipment:   Intra-op Plan:   Post-operative Plan:   Informed Consent: I have reviewed the patients History and Physical, chart, labs and discussed the procedure including the risks, benefits and alternatives for the proposed anesthesia with the patient or authorized representative who has indicated his/her understanding and acceptance.     Dental advisory given  Plan Discussed with: CRNA and Surgeon  Anesthesia Plan Comments:         Anesthesia Quick Evaluation

## 2022-04-05 NOTE — Transfer of Care (Signed)
Immediate Anesthesia Transfer of Care Note  Patient: [name]  Procedure(s) Performed: COLONOSCOPY WITH PROPOFOL POLYPECTOMY  Patient Location: Short Stay  Anesthesia Type:General  Level of Consciousness: awake and patient cooperative  Airway & Oxygen Therapy: Patient Spontanous Breathing  Post-op Assessment: Report given to RN and Post -op Vital signs reviewed and stable  Post vital signs: Reviewed and stable  Last Vitals:  Vitals Value Taken Time  BP 115/87 04/05/22 1149  Temp 36.4 C 04/05/22 1149  Pulse 81 04/05/22 1149  Resp 20 04/05/22 1149  SpO2 98 % 04/05/22 1149    Last Pain:  Vitals:   04/05/22 1149  TempSrc: Oral  PainSc: 0-No pain      Patients Stated Pain Goal: 7 (14/70/92 9574)  Complications: No notable events documented.

## 2022-04-05 NOTE — Op Note (Signed)
[facility] Patient Name: [name] Procedure Date: 04/05/2022 10:42 AM MRN: [id number] Date of Birth: February 03, 1956 Attending MD: Maylon Peppers , , [id number] CSN: [id number] Age: 67 Admit Type: Outpatient Procedure:                Colonoscopy Indications:              Screening for colorectal malignant neoplasm Providers:                Maylon Peppers, Caprice Kluver Referring MD:              Medicines:                Monitored Anesthesia Care Complications:            No immediate complications. Estimated Blood Loss:     Estimated blood loss: none. Procedure:                Pre-Anesthesia Assessment:                           - Prior to the procedure, a History and Physical                            was performed, and patient medications, allergies                            and sensitivities were reviewed. The patient's                            tolerance of previous anesthesia was reviewed.                           - The risks and benefits of the procedure and the                            sedation options and risks were discussed with the                            patient. All questions were answered and informed                            consent was obtained.                           - ASA Grade Assessment: II - A patient with mild                            systemic disease.                           After obtaining informed consent, the colonoscope                            was passed under direct vision. Throughout the                            procedure, the patient's blood pressure, pulse, and  oxygen saturations were monitored continuously. The                            [id number]-HQ190L ([id number]) scope was introduced through                            the anus and advanced to the the cecum, identified                            by appendiceal orifice and ileocecal valve. The                            colonoscopy was technically  difficult and complex                            due to a tortuous colon. Successful completion of                            the procedure was aided by applying abdominal                            pressure. The patient tolerated the procedure well.                            The quality of the bowel preparation was excellent. Scope In: 11:10:46 AM Scope Out: 11:44:09 AM Scope Withdrawal Time: 0 hours 15 minutes 27 seconds  Total Procedure Duration: 0 hours 33 minutes 23 seconds  Findings:      The perianal and digital rectal examinations were normal.      A 3 mm polyp was found in the sigmoid colon. The polyp was semi-sessile       and adjacent to a diverticula. The polyp was removed with a cold biopsy       forceps. Resection and retrieval were complete.      Scattered medium-mouthed and small-mouthed diverticula were found in the       entire colon.      The distal sigmoid colon was significantly tortuous. Intermittent       abdominal pressure was applied in the RLQ and LUQ area to effectively       advance the scope.      Non-bleeding internal hemorrhoids were found during retroflexion. The       hemorrhoids were small. Impression:               - One 3 mm polyp in the sigmoid colon, removed with                            a cold biopsy forceps. Resected and retrieved.                           - Diverticulosis in the entire examined colon.                           - Tortuous colon.                           -  Non-bleeding internal hemorrhoids. Moderate Sedation:      Per Anesthesia Care Recommendation:           - Discharge patient to home (ambulatory).                           - Resume previous diet.                           - Await pathology results.                           - Repeat colonoscopy in 10 years for screening                            purposes. Procedure Code(s):        --- Professional ---                           [phone], Colonoscopy, flexible; with biopsy,  single                            or multiple Diagnosis Code(s):        --- Professional ---                           Z12.11, Encounter for screening for malignant                            neoplasm of colon                           D12.5, Benign neoplasm of sigmoid colon                           K64.8, Other hemorrhoids                           K57.30, Diverticulosis of large intestine without                            perforation or abscess without bleeding                           Q43.8, Other specified congenital malformations of                            intestine CPT copyright 2022 American [employer] Association. All rights reserved. The codes documented in this report are preliminary and upon coder review may  be revised to meet current compliance requirements. Maylon Peppers, MD Maylon Peppers,  04/05/2022 11:51:44 AM This report has been signed electronically. Number of Addenda: 0

## 2022-04-05 NOTE — H&P (Signed)
[name] is an 67 y.o. female.   Chief Complaint: Screening colonoscopy HPI: 67 year old female with past medical history of arthritis, PE, hypertension, coming for screening colonoscopy. The patient has never had a colonoscopy in the past.  The patient denies having any complaints such as melena, hematochezia, abdominal pain or distention, change in her bowel movement consistency or frequency, no changes in weight recently.  No family history of colorectal cancer.   Past Medical History:  Diagnosis Date   Arthritis    Complication of anesthesia    low BP during anesthesia   Hypertension    Pulmonary embolism ([place])    after cervical fusion 2016    Past Surgical History:  Procedure Laterality Date   ABDOMINAL HYSTERECTOMY     APPENDECTOMY     CERVICAL FUSION      History reviewed. No pertinent family history. Social History:  reports that she has never smoked. She does not have any smokeless tobacco history on file. She reports current alcohol use. She reports that she does not use drugs.  Allergies:  Allergies  Allergen Reactions   Oxycodone Nausea Only   Biaxin [Clarithromycin] Hives   Codeine Nausea Only   Tetracyclines & Related Hives    Medications Prior to Admission  Medication Sig Dispense Refill   acetaminophen (TYLENOL) 500 MG tablet Take 500-1,000 mg by mouth every 6 (six) hours as needed (pain.).     Aspirin-Acetaminophen-Caffeine (BC FAST PAIN RELIEF MAX STR) 500-500-65 MG PACK Take 1 packet by mouth daily as needed (pain.).     HUMIRA PEN 40 MG/0.8ML PNKT Inject 40 mg into the skin every 14 (fourteen) days.      lisinopril (PRINIVIL,ZESTRIL) 10 MG tablet Take 10 mg by mouth in the morning.     naproxen sodium (ALEVE) 220 MG tablet Take 220-440 mg by mouth 2 (two) times daily as needed (pain.).      No results found for this or any previous visit (from the past 48 hour(s)). No results found.  Review of Systems  All other systems reviewed and are  negative.   Blood pressure (!) 148/88, temperature 97.9 F (36.6 C), temperature source Oral, resp. rate 16, SpO2 98 %. Physical Exam  GENERAL: The patient is AO x3, in no acute distress. HEENT: Head is normocephalic and atraumatic. EOMI are intact. Mouth is well hydrated and without lesions. NECK: Supple. No masses LUNGS: Clear to auscultation. No presence of rhonchi/wheezing/rales. Adequate chest expansion HEART: RRR, normal s1 and s2. ABDOMEN: Soft, nontender, no guarding, no peritoneal signs, and nondistended. BS +. No masses. EXTREMITIES: Without any cyanosis, clubbing, rash, lesions or edema. NEUROLOGIC: AOx3, no focal motor deficit. SKIN: no jaundice, no rashes   Assessment/Plan 67 year old female with past medical history of arthritis, PE, hypertension, coming for screening colonoscopy. The patient is at average risk for colorectal cancer.  We will proceed with colonoscopy today.   Harvel Quale, MD 04/05/2022, 11:05 AM

## 2022-04-07 LAB — SURGICAL PATHOLOGY

## 2022-04-11 ENCOUNTER — Encounter (HOSPITAL_COMMUNITY): Payer: Self-pay | Admitting: Gastroenterology

## 2022-04-14 IMAGING — DX DG CHEST 2V
2 series · 2 of 2 positions shown · non-contrast
Comparison: 04/30/2021, 09/06/2004

CLINICAL DATA: 66-year-old female with left lower lobe pneumonia

EXAM:
CHEST - 2 VIEW

[chest pa]
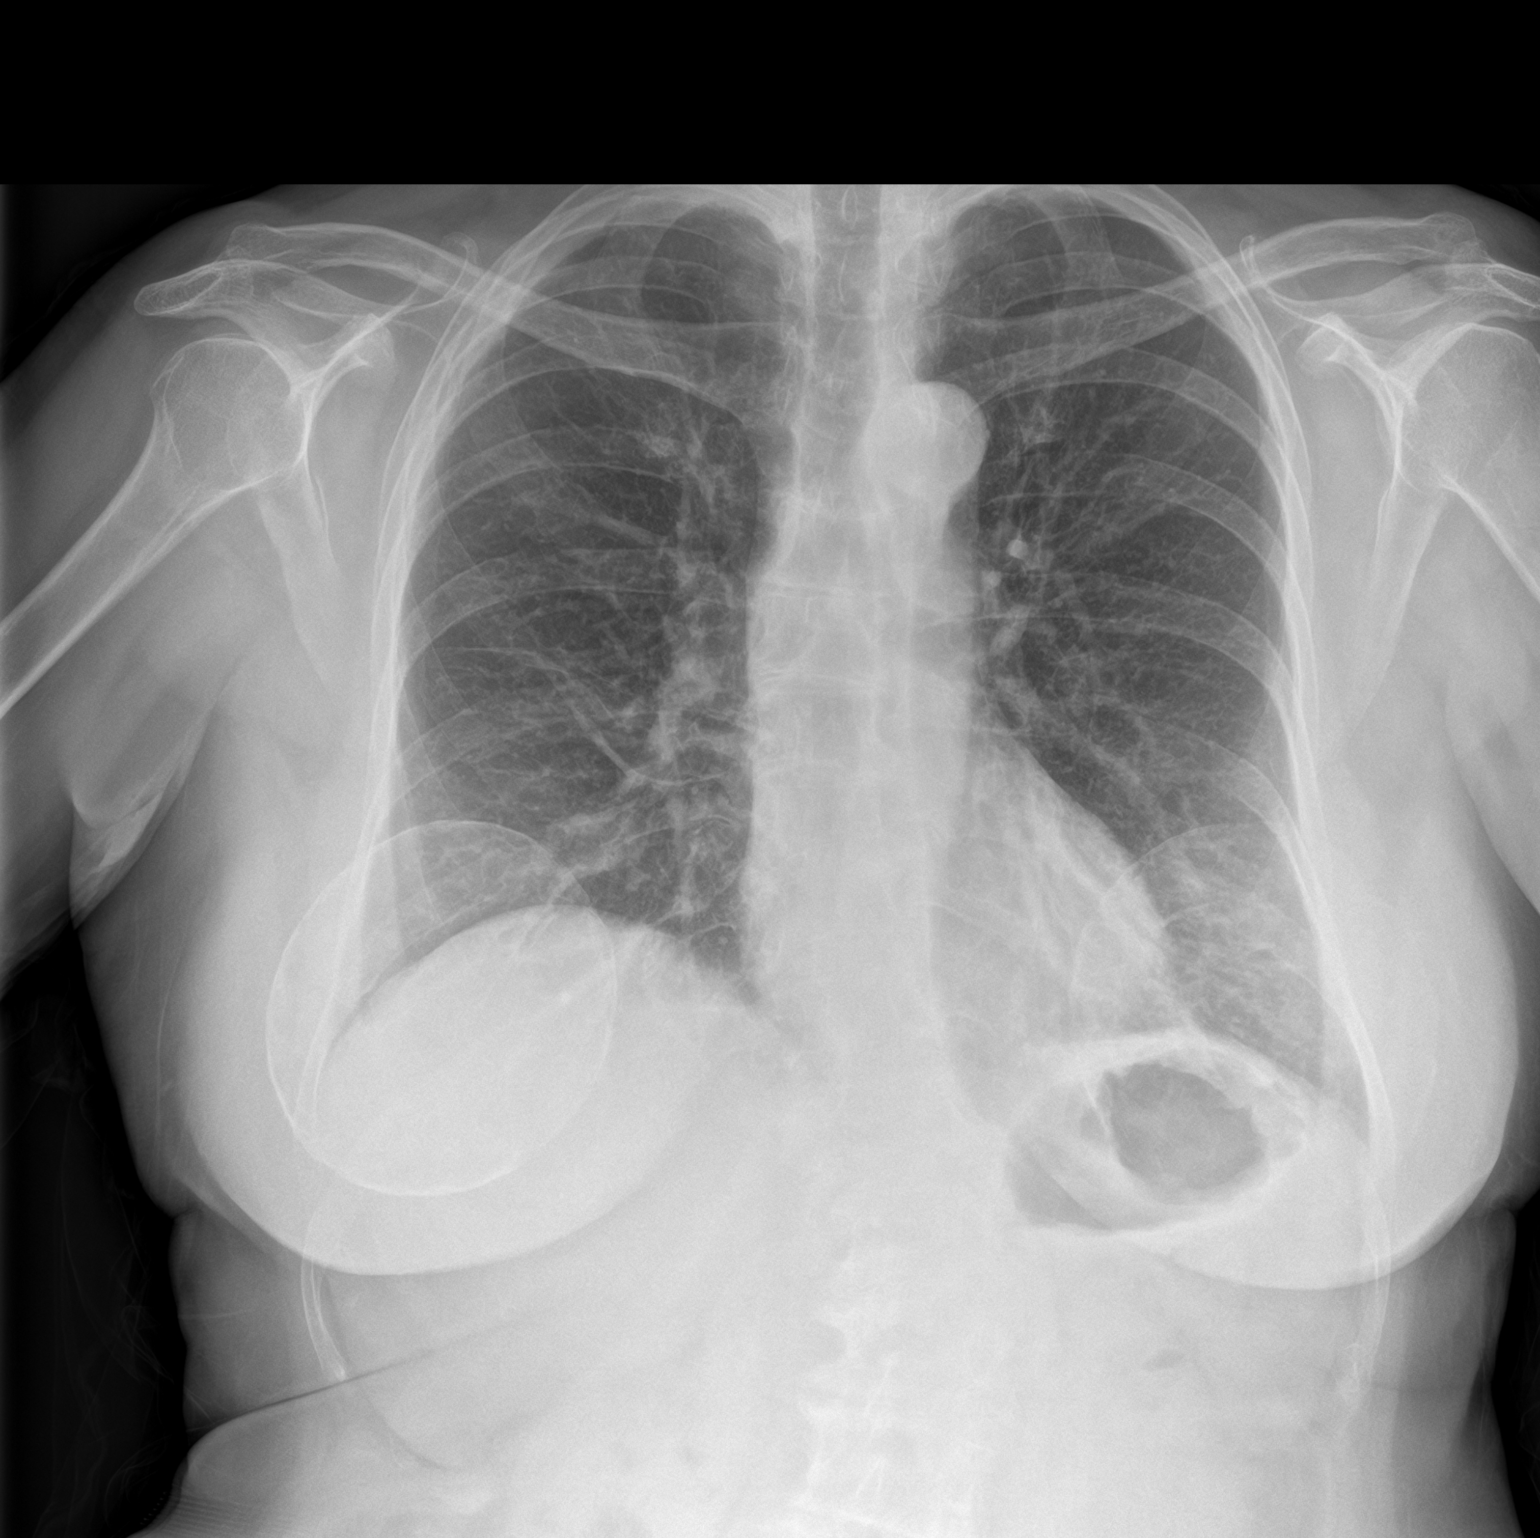

[chest lat]
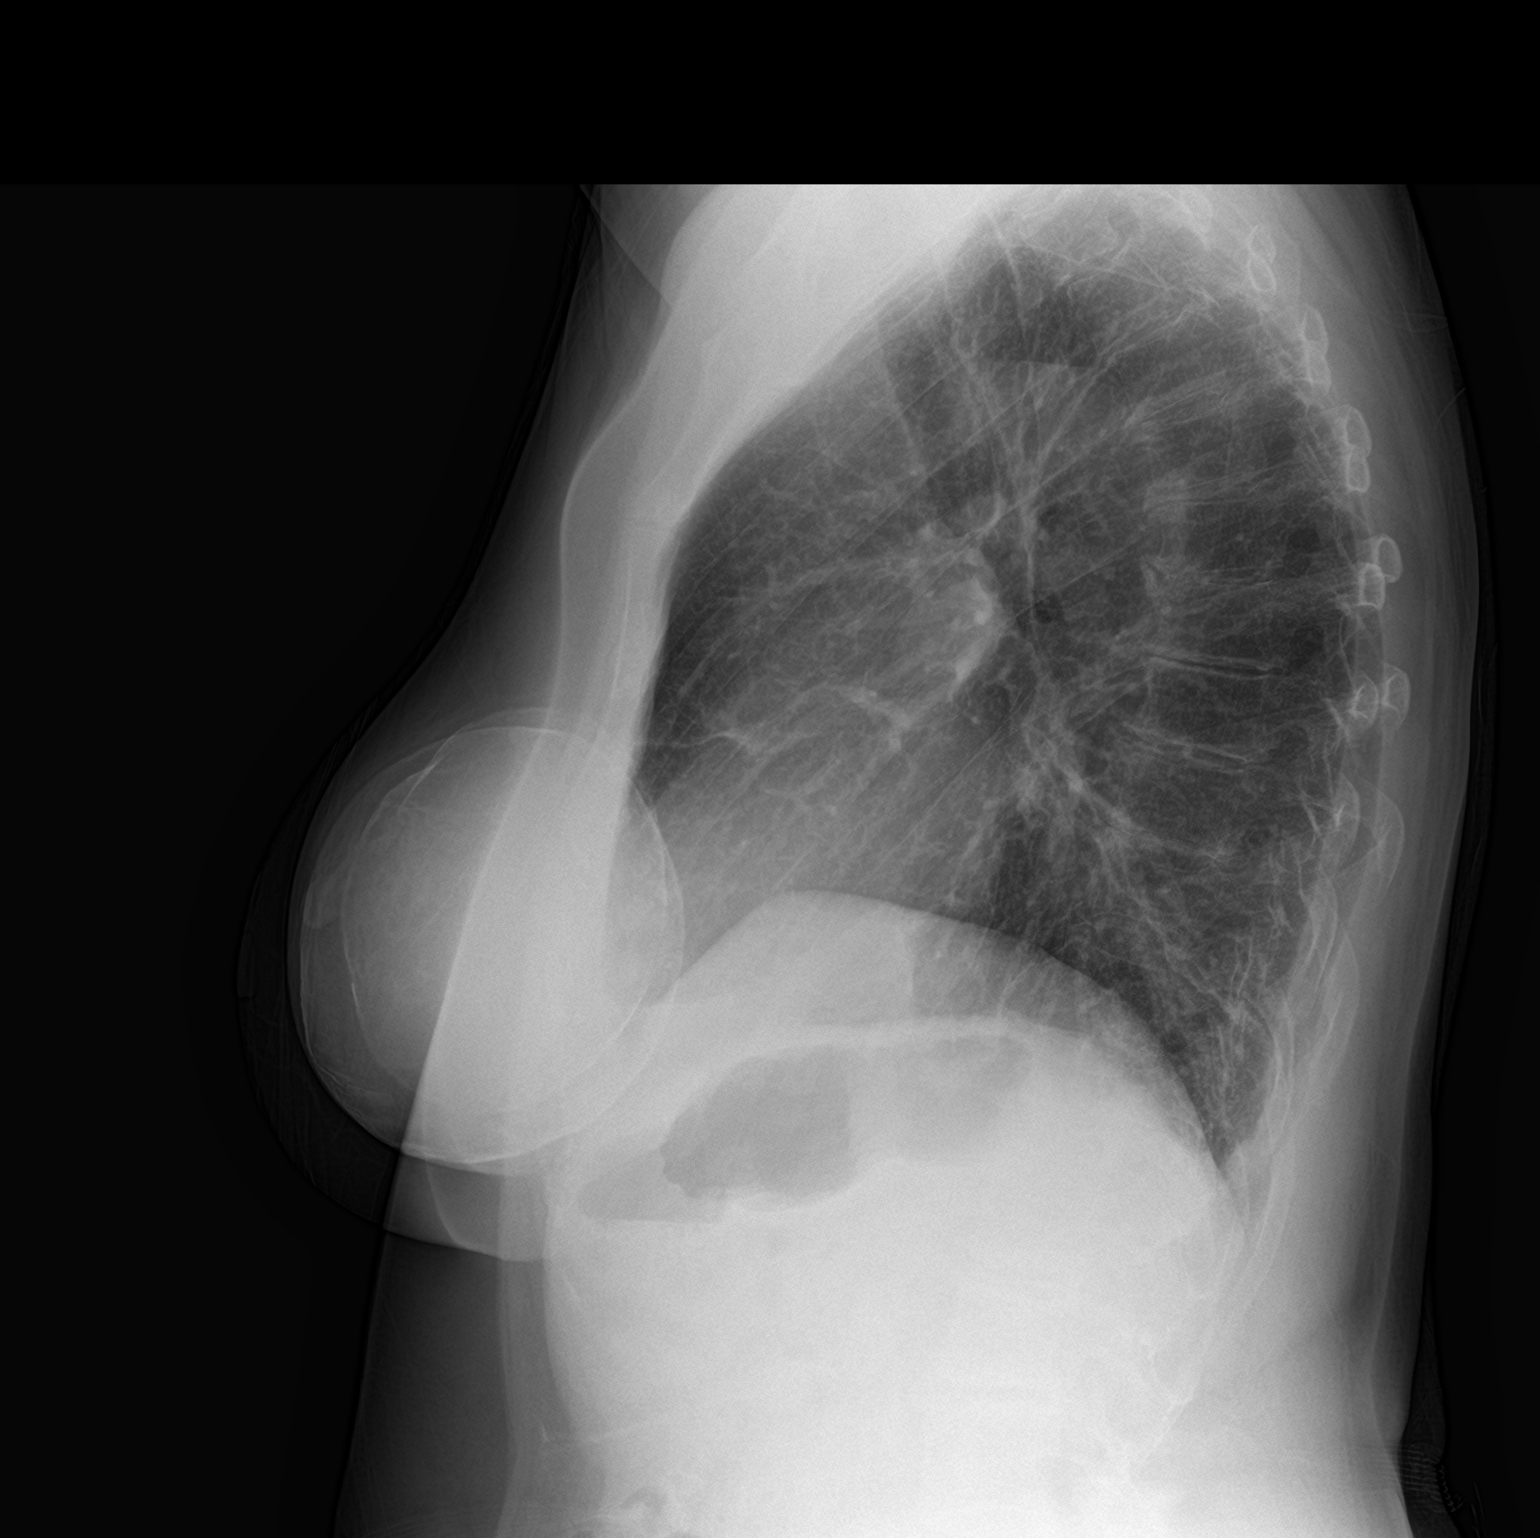

[2 of 2 positions shown; findings below may reference images not displayed]

FINDINGS: Cardiomediastinal silhouette unchanged in size and contour. No
evidence of central vascular congestion. No interlobular septal
thickening.

Mild linear opacities at the bilateral lung bases, with no new
confluent airspace disease. The appearance is similar to the most
recent chest x-ray of 04/30/2021, and new from remote chest x-ray of
09/06/2004.

No pneumothorax or pleural effusion. Coarsened interstitial markings
again noted.

No acute displaced fracture. Degenerative changes of the spine.

Surgical changes of the cervical region incompletely imaged.
IMPRESSION: Mild linear opacities at the bilateral lung bases, relatively
unchanged from most recent comparison, potentially
scarring/atelectasis, with no new airspace disease.

## 2022-04-18 DIAGNOSIS — Z1231 Encounter for screening mammogram for malignant neoplasm of breast: Secondary | ICD-10-CM | POA: Diagnosis not present

## 2022-04-18 DIAGNOSIS — R92323 Mammographic fibroglandular density, bilateral breasts: Secondary | ICD-10-CM | POA: Diagnosis not present

## 2022-05-04 DIAGNOSIS — M0579 Rheumatoid arthritis with rheumatoid factor of multiple sites without organ or systems involvement: Secondary | ICD-10-CM | POA: Diagnosis not present

## 2022-05-04 DIAGNOSIS — Z5181 Encounter for therapeutic drug level monitoring: Secondary | ICD-10-CM | POA: Diagnosis not present

## 2022-05-04 DIAGNOSIS — Z79899 Other long term (current) drug therapy: Secondary | ICD-10-CM | POA: Diagnosis not present

## 2022-05-04 DIAGNOSIS — M1811 Unilateral primary osteoarthritis of first carpometacarpal joint, right hand: Secondary | ICD-10-CM | POA: Diagnosis not present

## 2022-06-20 DIAGNOSIS — L209 Atopic dermatitis, unspecified: Secondary | ICD-10-CM | POA: Diagnosis not present

## 2022-09-23 DIAGNOSIS — M1712 Unilateral primary osteoarthritis, left knee: Secondary | ICD-10-CM | POA: Diagnosis not present

## 2022-12-07 DIAGNOSIS — Z79899 Other long term (current) drug therapy: Secondary | ICD-10-CM | POA: Diagnosis not present

## 2022-12-07 DIAGNOSIS — Z5181 Encounter for therapeutic drug level monitoring: Secondary | ICD-10-CM | POA: Diagnosis not present

## 2022-12-07 DIAGNOSIS — M0579 Rheumatoid arthritis with rheumatoid factor of multiple sites without organ or systems involvement: Secondary | ICD-10-CM | POA: Diagnosis not present

## 2023-01-17 DIAGNOSIS — Z23 Encounter for immunization: Secondary | ICD-10-CM | POA: Diagnosis not present

## 2023-04-20 DIAGNOSIS — R92323 Mammographic fibroglandular density, bilateral breasts: Secondary | ICD-10-CM | POA: Diagnosis not present

## 2023-04-20 DIAGNOSIS — Z1231 Encounter for screening mammogram for malignant neoplasm of breast: Secondary | ICD-10-CM | POA: Diagnosis not present

## 2023-05-01 DIAGNOSIS — E785 Hyperlipidemia, unspecified: Secondary | ICD-10-CM | POA: Diagnosis not present

## 2023-05-01 DIAGNOSIS — M519 Unspecified thoracic, thoracolumbar and lumbosacral intervertebral disc disorder: Secondary | ICD-10-CM | POA: Diagnosis not present

## 2023-05-03 DIAGNOSIS — Z79899 Other long term (current) drug therapy: Secondary | ICD-10-CM | POA: Diagnosis not present

## 2023-05-03 DIAGNOSIS — R635 Abnormal weight gain: Secondary | ICD-10-CM | POA: Diagnosis not present

## 2023-06-21 DIAGNOSIS — M519 Unspecified thoracic, thoracolumbar and lumbosacral intervertebral disc disorder: Secondary | ICD-10-CM | POA: Diagnosis not present

## 2023-06-21 DIAGNOSIS — E663 Overweight: Secondary | ICD-10-CM | POA: Diagnosis not present

## 2023-06-28 DIAGNOSIS — M0579 Rheumatoid arthritis with rheumatoid factor of multiple sites without organ or systems involvement: Secondary | ICD-10-CM | POA: Diagnosis not present

## 2023-06-28 DIAGNOSIS — A6920 Lyme disease, unspecified: Secondary | ICD-10-CM | POA: Diagnosis not present

## 2023-06-28 DIAGNOSIS — Z5181 Encounter for therapeutic drug level monitoring: Secondary | ICD-10-CM | POA: Diagnosis not present

## 2023-06-28 DIAGNOSIS — Z7960 Long term (current) use of unspecified immunomodulators and immunosuppressants: Secondary | ICD-10-CM | POA: Diagnosis not present

## 2023-06-28 DIAGNOSIS — I10 Essential (primary) hypertension: Secondary | ICD-10-CM | POA: Diagnosis not present

## 2023-11-28 DIAGNOSIS — K5732 Diverticulitis of large intestine without perforation or abscess without bleeding: Secondary | ICD-10-CM | POA: Diagnosis not present

## 2023-12-27 DIAGNOSIS — Z23 Encounter for immunization: Secondary | ICD-10-CM | POA: Diagnosis not present

## 2024-01-10 ENCOUNTER — Encounter (INDEPENDENT_AMBULATORY_CARE_PROVIDER_SITE_OTHER): Payer: Self-pay | Admitting: Gastroenterology

## 2024-01-31 DIAGNOSIS — Z23 Encounter for immunization: Secondary | ICD-10-CM | POA: Diagnosis not present

## 2024-01-31 DIAGNOSIS — M0579 Rheumatoid arthritis with rheumatoid factor of multiple sites without organ or systems involvement: Secondary | ICD-10-CM | POA: Diagnosis not present

## 2024-01-31 DIAGNOSIS — Z5181 Encounter for therapeutic drug level monitoring: Secondary | ICD-10-CM | POA: Diagnosis not present

## 2024-01-31 DIAGNOSIS — Z7960 Long term (current) use of unspecified immunomodulators and immunosuppressants: Secondary | ICD-10-CM | POA: Diagnosis not present

## 2024-01-31 DIAGNOSIS — L309 Dermatitis, unspecified: Secondary | ICD-10-CM | POA: Diagnosis not present

## 2024-01-31 DIAGNOSIS — H9193 Unspecified hearing loss, bilateral: Secondary | ICD-10-CM | POA: Diagnosis not present

## 2024-03-12 ENCOUNTER — Ambulatory Visit (HOSPITAL_COMMUNITY)
Admission: RE | Admit: 2024-03-12 | Discharge: 2024-03-12 | Disposition: A | Discharge status: Home / Self Care | Source: Ambulatory Visit | Attending: Internal Medicine | Admitting: Internal Medicine

## 2024-03-12 ENCOUNTER — Other Ambulatory Visit (HOSPITAL_COMMUNITY): Payer: Self-pay | Admitting: Internal Medicine

## 2024-03-12 DIAGNOSIS — R053 Chronic cough: Secondary | ICD-10-CM

## 2024-04-10 ENCOUNTER — Other Ambulatory Visit (HOSPITAL_COMMUNITY): Payer: Self-pay | Admitting: Internal Medicine

## 2024-04-10 DIAGNOSIS — Z8701 Personal history of pneumonia (recurrent): Secondary | ICD-10-CM
